# Patient Record
Sex: Male | Born: 1970
Health system: Western US, Academic
[De-identification: ages and names within clinical notes are randomized; demographics above are authoritative.]

## PROBLEM LIST (undated history)

## (undated) HISTORY — PX: OTHER SURGICAL HISTORY: SHX169

---

## 2013-11-19 ENCOUNTER — Telehealth (INDEPENDENT_AMBULATORY_CARE_PROVIDER_SITE_OTHER): Payer: Self-pay | Admitting: Micrographic Dermatologic Surgery

## 2013-11-19 NOTE — Telephone Encounter (Signed)
Lm on 11/19/13 for pt to call our office to schedule pre-op appointment with Dr. Tylene FantasiaJiang

## 2019-11-12 DIAGNOSIS — U071 COVID-19: Secondary | ICD-10-CM

## 2019-11-12 HISTORY — DX: COVID-19: U07.1

## 2019-11-21 ENCOUNTER — Encounter (HOSPITAL_BASED_OUTPATIENT_CLINIC_OR_DEPARTMENT_OTHER): Payer: Self-pay | Admitting: *Deleted

## 2019-11-21 ENCOUNTER — Other Ambulatory Visit: Payer: Self-pay

## 2019-11-21 ENCOUNTER — Emergency Department (HOSPITAL_BASED_OUTPATIENT_CLINIC_OR_DEPARTMENT_OTHER): Payer: BC Managed Care – PPO

## 2019-11-21 ENCOUNTER — Inpatient Hospital Stay (HOSPITAL_BASED_OUTPATIENT_CLINIC_OR_DEPARTMENT_OTHER)
Admission: EM | Admit: 2019-11-21 | Discharge: 2019-12-13 | DRG: 871 | Disposition: E | Payer: BC Managed Care – PPO | Attending: Internal Medicine | Admitting: Internal Medicine

## 2019-11-21 DIAGNOSIS — M25561 Pain in right knee: Secondary | ICD-10-CM | POA: Diagnosis present

## 2019-11-21 DIAGNOSIS — R0602 Shortness of breath: Secondary | ICD-10-CM | POA: Diagnosis not present

## 2019-11-21 DIAGNOSIS — R578 Other shock: Secondary | ICD-10-CM | POA: Diagnosis not present

## 2019-11-21 DIAGNOSIS — J9383 Other pneumothorax: Secondary | ICD-10-CM | POA: Diagnosis not present

## 2019-11-21 DIAGNOSIS — A4189 Other specified sepsis: Secondary | ICD-10-CM | POA: Diagnosis not present

## 2019-11-21 DIAGNOSIS — U071 COVID-19: Secondary | ICD-10-CM

## 2019-11-21 DIAGNOSIS — R0902 Hypoxemia: Secondary | ICD-10-CM

## 2019-11-21 DIAGNOSIS — Z4659 Encounter for fitting and adjustment of other gastrointestinal appliance and device: Secondary | ICD-10-CM

## 2019-11-21 DIAGNOSIS — J1282 Pneumonia due to coronavirus disease 2019: Secondary | ICD-10-CM

## 2019-11-21 DIAGNOSIS — Y92239 Unspecified place in hospital as the place of occurrence of the external cause: Secondary | ICD-10-CM | POA: Diagnosis present

## 2019-11-21 DIAGNOSIS — J9690 Respiratory failure, unspecified, unspecified whether with hypoxia or hypercapnia: Secondary | ICD-10-CM

## 2019-11-21 DIAGNOSIS — N179 Acute kidney failure, unspecified: Secondary | ICD-10-CM

## 2019-11-21 DIAGNOSIS — E669 Obesity, unspecified: Secondary | ICD-10-CM | POA: Diagnosis present

## 2019-11-21 DIAGNOSIS — Z66 Do not resuscitate: Secondary | ICD-10-CM | POA: Diagnosis not present

## 2019-11-21 DIAGNOSIS — L89811 Pressure ulcer of head, stage 1: Secondary | ICD-10-CM | POA: Diagnosis not present

## 2019-11-21 DIAGNOSIS — J15211 Pneumonia due to Methicillin susceptible Staphylococcus aureus: Secondary | ICD-10-CM | POA: Diagnosis not present

## 2019-11-21 DIAGNOSIS — I517 Cardiomegaly: Secondary | ICD-10-CM | POA: Diagnosis present

## 2019-11-21 DIAGNOSIS — G9341 Metabolic encephalopathy: Secondary | ICD-10-CM

## 2019-11-21 DIAGNOSIS — B370 Candidal stomatitis: Secondary | ICD-10-CM

## 2019-11-21 DIAGNOSIS — R0600 Dyspnea, unspecified: Secondary | ICD-10-CM

## 2019-11-21 DIAGNOSIS — R34 Anuria and oliguria: Secondary | ICD-10-CM | POA: Diagnosis not present

## 2019-11-21 DIAGNOSIS — Z6839 Body mass index (BMI) 39.0-39.9, adult: Secondary | ICD-10-CM

## 2019-11-21 DIAGNOSIS — Z452 Encounter for adjustment and management of vascular access device: Secondary | ICD-10-CM

## 2019-11-21 DIAGNOSIS — D62 Acute posthemorrhagic anemia: Secondary | ICD-10-CM | POA: Diagnosis not present

## 2019-11-21 DIAGNOSIS — Z515 Encounter for palliative care: Secondary | ICD-10-CM

## 2019-11-21 DIAGNOSIS — L8990 Pressure ulcer of unspecified site, unspecified stage: Secondary | ICD-10-CM | POA: Insufficient documentation

## 2019-11-21 DIAGNOSIS — I469 Cardiac arrest, cause unspecified: Secondary | ICD-10-CM | POA: Diagnosis not present

## 2019-11-21 DIAGNOSIS — T82838A Hemorrhage of vascular prosthetic devices, implants and grafts, initial encounter: Secondary | ICD-10-CM | POA: Diagnosis not present

## 2019-11-21 DIAGNOSIS — Z9911 Dependence on respirator [ventilator] status: Secondary | ICD-10-CM

## 2019-11-21 DIAGNOSIS — J80 Acute respiratory distress syndrome: Secondary | ICD-10-CM | POA: Diagnosis not present

## 2019-11-21 DIAGNOSIS — Y838 Other surgical procedures as the cause of abnormal reaction of the patient, or of later complication, without mention of misadventure at the time of the procedure: Secondary | ICD-10-CM | POA: Diagnosis present

## 2019-11-21 DIAGNOSIS — R739 Hyperglycemia, unspecified: Secondary | ICD-10-CM | POA: Diagnosis present

## 2019-11-21 DIAGNOSIS — T380X5A Adverse effect of glucocorticoids and synthetic analogues, initial encounter: Secondary | ICD-10-CM

## 2019-11-21 DIAGNOSIS — F410 Panic disorder [episodic paroxysmal anxiety] without agoraphobia: Secondary | ICD-10-CM | POA: Diagnosis present

## 2019-11-21 DIAGNOSIS — E66812 Obesity, class 2: Secondary | ICD-10-CM

## 2019-11-21 DIAGNOSIS — I2699 Other pulmonary embolism without acute cor pulmonale: Secondary | ICD-10-CM

## 2019-11-21 DIAGNOSIS — J9601 Acute respiratory failure with hypoxia: Secondary | ICD-10-CM

## 2019-11-21 DIAGNOSIS — E875 Hyperkalemia: Secondary | ICD-10-CM | POA: Diagnosis not present

## 2019-11-21 DIAGNOSIS — J9600 Acute respiratory failure, unspecified whether with hypoxia or hypercapnia: Secondary | ICD-10-CM

## 2019-11-21 DIAGNOSIS — R040 Epistaxis: Secondary | ICD-10-CM | POA: Diagnosis present

## 2019-11-21 LAB — CBC WITH DIFFERENTIAL/PLATELET
Abs Immature Granulocytes: 0.03 10*3/uL (ref 0.00–0.07)
Basophils Absolute: 0 10*3/uL (ref 0.0–0.1)
Basophils Relative: 0 %
Eosinophils Absolute: 0 10*3/uL (ref 0.0–0.5)
Eosinophils Relative: 0 %
HCT: 42.3 % (ref 39.0–52.0)
Hemoglobin: 14.2 g/dL (ref 13.0–17.0)
Immature Granulocytes: 0 %
Lymphocytes Relative: 25 %
Lymphs Abs: 1.8 10*3/uL (ref 0.7–4.0)
MCH: 30.4 pg (ref 26.0–34.0)
MCHC: 33.6 g/dL (ref 30.0–36.0)
MCV: 90.6 fL (ref 80.0–100.0)
Monocytes Absolute: 0.2 10*3/uL (ref 0.1–1.0)
Monocytes Relative: 3 %
Neutro Abs: 5.2 10*3/uL (ref 1.7–7.7)
Neutrophils Relative %: 72 %
Platelets: 200 10*3/uL (ref 150–400)
RBC: 4.67 MIL/uL (ref 4.22–5.81)
RDW: 12.7 % (ref 11.5–15.5)
Smear Review: NORMAL
WBC Morphology: ABNORMAL
WBC: 7.2 10*3/uL (ref 4.0–10.5)
nRBC: 0 % (ref 0.0–0.2)

## 2019-11-21 LAB — COMPREHENSIVE METABOLIC PANEL
ALT: 39 U/L (ref 0–44)
AST: 77 U/L — ABNORMAL HIGH (ref 15–41)
Albumin: 3.1 g/dL — ABNORMAL LOW (ref 3.5–5.0)
Alkaline Phosphatase: 76 U/L (ref 38–126)
Anion gap: 13 (ref 5–15)
BUN: 11 mg/dL (ref 6–20)
CO2: 24 mmol/L (ref 22–32)
Calcium: 7.9 mg/dL — ABNORMAL LOW (ref 8.9–10.3)
Chloride: 99 mmol/L (ref 98–111)
Creatinine, Ser: 1.24 mg/dL (ref 0.61–1.24)
GFR, Estimated: >60 mL/min (ref >60)
Glucose, Bld: 139 mg/dL — ABNORMAL HIGH (ref 70–99)
Potassium: 3.7 mmol/L (ref 3.5–5.1)
Sodium: 136 mmol/L (ref 135–145)
Total Bilirubin: 0.4 mg/dL (ref 0.3–1.2)
Total Protein: 7.2 g/dL (ref 6.5–8.1)

## 2019-11-21 LAB — RESPIRATORY PANEL BY RT PCR (FLU A&B, COVID)
Influenza A by PCR: NEGATIVE
Influenza B by PCR: NEGATIVE
SARS Coronavirus 2 by RT PCR: POSITIVE — AB

## 2019-11-21 LAB — D-DIMER, QUANTITATIVE: D-Dimer, Quant: 0.69 ug/mL-FEU — ABNORMAL HIGH (ref 0.00–0.50)

## 2019-11-21 LAB — LACTIC ACID, PLASMA
Lactic Acid, Venous: 3 mmol/L (ref 0.5–1.9)
Lactic Acid, Venous: 1.2 mmol/L (ref 0.5–1.9)

## 2019-11-21 MED ORDER — SODIUM CHLORIDE 0.9 % IV SOLN
100.0000 mg | Freq: Every day | INTRAVENOUS | Status: AC
  Administered 2019-11-22 – 2019-11-25 (×4): 100 mg via INTRAVENOUS
  Filled 2019-11-21 (×4): qty 20

## 2019-11-21 MED ORDER — ACETAMINOPHEN 500 MG PO TABS
1000.0000 mg | ORAL_TABLET | Freq: Once | ORAL | Status: AC
  Administered 2019-11-21: 1000 mg via ORAL
  Filled 2019-11-21: qty 2

## 2019-11-21 MED ORDER — DEXAMETHASONE SODIUM PHOSPHATE 10 MG/ML IJ SOLN
8.0000 mg | Freq: Once | INTRAMUSCULAR | Status: AC
  Administered 2019-11-21: 8 mg via INTRAVENOUS
  Filled 2019-11-21: qty 1

## 2019-11-21 MED ORDER — SODIUM CHLORIDE 0.9 % IV SOLN
100.0000 mg | INTRAVENOUS | Status: AC
  Administered 2019-11-21 – 2019-11-22 (×2): 100 mg via INTRAVENOUS
  Filled 2019-11-21 (×2): qty 20

## 2019-11-21 MED ORDER — SODIUM CHLORIDE 0.9 % IV SOLN
INTRAVENOUS | Status: DC | PRN
  Administered 2019-11-21: 1000 mL via INTRAVENOUS
  Administered 2019-11-22: 250 mL via INTRAVENOUS

## 2019-11-21 MED ORDER — SODIUM CHLORIDE 0.9 % IV BOLUS
500.0000 mL | Freq: Once | INTRAVENOUS | Status: AC
  Administered 2019-11-21: 500 mL via INTRAVENOUS

## 2019-11-21 NOTE — ED Notes (Signed)
Stuart Singh, [place] aware of pt's lactic acid level of 3.0. I spoke with pt's girlfriend and updated per his request.

## 2019-11-21 NOTE — ED Provider Notes (Signed)
[facility] EMERGENCY DEPARTMENT Provider Note   CSN: [id number] Arrival date & time: 12/02/19  2048     History Chief Complaint  Patient presents with  . Shortness of Breath    Stuart Singh is a 49 y.o. male who presents for evaluation of difficulty breathing.  States that this has been going on for about a day but worsened today.  States he has had some headache and achiness.  He has not noted any fevers but has felt hot.  He does not smoke and denies any history of asthma or COPD.  He does state that he has been vaccinated for Covid.  He has not been around anybody has been sick.  He denies any chest pain, abdominal pain, vomiting.  EM LEVEL 5 CAVEAT DUE TO ACUITY OF CONDITION   The history is provided by the patient.       History reviewed. No pertinent past medical history.  Patient Active Problem List   Diagnosis Date Noted  . Pneumonia due to COVID-19 virus December 02, 2019     The histories are not reviewed yet. Please review them in the "History" navigator section and refresh this SmartLink.     No family history on file.  Social History   Tobacco Use  . Smoking status: Not on file  Substance Use Topics  . Alcohol use: Not on file  . Drug use: Not on file    Home Medications Prior to Admission medications   Not on File    Allergies    Anacin-3 [acetaminophen]  Review of Systems   Review of Systems  Constitutional: Positive for fever.  Respiratory: Positive for shortness of breath. Negative for cough.   Cardiovascular: Negative for chest pain.  Gastrointestinal: Negative for abdominal pain, nausea and vomiting.  Musculoskeletal: Positive for myalgias.  Neurological: Positive for headaches.  All other systems reviewed and are negative.   Physical Exam Updated Vital Signs BP 117/77   Pulse 93   Temp (!) 101.7 F (38.7 C) (Oral)   Resp (!) 43   Ht 5\' 8"  (1.727 m)   Wt 117.9 kg   SpO2 97%   BMI 39.53 kg/m   Physical Exam Vitals  and nursing note reviewed.  Constitutional:      Appearance: Normal appearance. He is well-developed. He is ill-appearing and diaphoretic.  HENT:     Head: Normocephalic and atraumatic.  Eyes:     General: Lids are normal.     Conjunctiva/sclera: Conjunctivae normal.     Pupils: Pupils are equal, round, and reactive to light.  Cardiovascular:     Rate and Rhythm: Normal rate and regular rhythm.     Pulses: Normal pulses.          Radial pulses are 2+ on the right side and 2+ on the left side.       Dorsalis pedis pulses are 2+ on the right side and 2+ on the left side.     Heart sounds: Normal heart sounds. No murmur heard.  No friction rub. No gallop.   Pulmonary:     Effort: Tachypnea and accessory muscle usage present.     Breath sounds: Decreased breath sounds and rales present.     Comments: Patient is tachypneic, with accessory muscle usage noted.  Rales noted diffusely in the left side.  Decreased air movement noted on the right.  Speaking in very short sentences this. Abdominal:     Palpations: Abdomen is soft. Abdomen is not rigid.  Tenderness: There is no abdominal tenderness. There is no guarding.     Comments: Abdomen is soft, non-distended, non-tender. No rigidity, No guarding. No peritoneal signs.  Musculoskeletal:        General: Normal range of motion.     Cervical back: Full passive range of motion without pain.     Comments: BLE are symmetric in appearance. No overlying warmth or erythema.   Skin:    General: Skin is warm.     Capillary Refill: Capillary refill takes less than 2 seconds.  Neurological:     Mental Status: He is alert and oriented to person, place, and time.  Psychiatric:        Speech: Speech normal.     ED Results / Procedures / Treatments   Labs (all labs ordered are listed, but only abnormal results are displayed) Labs Reviewed  RESPIRATORY PANEL BY RT PCR (FLU A&B, COVID) - Abnormal; Notable for the following components:      Result  Value   SARS Coronavirus 2 by RT PCR POSITIVE (*)    All other components within normal limits  LACTIC ACID, PLASMA - Abnormal; Notable for the following components:   Lactic Acid, Venous 3.0 (*)    All other components within normal limits  COMPREHENSIVE METABOLIC PANEL - Abnormal; Notable for the following components:   Glucose, Bld 139 (*)    Calcium 7.9 (*)    Albumin 3.1 (*)    AST 77 (*)    All other components within normal limits  D-DIMER, QUANTITATIVE (NOT AT [facility]) - Abnormal; Notable for the following components:   D-Dimer, Quant 0.69 (*)    All other components within normal limits  CULTURE, BLOOD (ROUTINE X 2)  CULTURE, BLOOD (ROUTINE X 2)  CBC WITH DIFFERENTIAL/PLATELET  LACTIC ACID, PLASMA  PROCALCITONIN  LACTATE DEHYDROGENASE  FERRITIN  TRIGLYCERIDES  FIBRINOGEN  C-REACTIVE PROTEIN    EKG None  Radiology DG Chest Port 1 View  Result Date: 12-19-19 CLINICAL DATA:  49 year old male with shortness of breath. EXAM: PORTABLE CHEST 1 VIEW COMPARISON:  None. FINDINGS: Bilateral confluent airspace opacities consistent with multifocal pneumonia likely viral or atypical in etiology including COVID-19. Clinical correlation is recommended. There is no pleural effusion pneumothorax. Top-normal cardiac silhouette. No acute osseous pathology. IMPRESSION: Multifocal pneumonia. Electronically Signed   By: Elgie Collard M.D.   On: 12-19-2019 21:02    Procedures .Critical Care Performed by: Maxwell Caul, PA-C Authorized by: Maxwell Caul, PA-C   Critical care provider statement:    Critical care time (minutes):  45   Critical care was necessary to treat or prevent imminent or life-threatening deterioration of the following conditions:  Respiratory failure   Critical care was time spent personally by me on the following activities:  Discussions with consultants, evaluation of patient's response to treatment, examination of patient, ordering and performing  treatments and interventions, ordering and review of laboratory studies, ordering and review of radiographic studies, pulse oximetry, re-evaluation of patient's condition, obtaining history from patient or surrogate and review of old charts   (including critical care time)  Medications Ordered in ED Medications  remdesivir 100 mg in sodium chloride 0.9 % 100 mL IVPB (has no administration in time range)  remdesivir 100 mg in sodium chloride 0.9 % 100 mL IVPB (has no administration in time range)  0.9 %  sodium chloride infusion (1,000 mLs Intravenous New Bag/Given December 19, 2019 2319)  dexamethasone (DECADRON) injection 8 mg (8 mg Intravenous Given Dec 19, 2019 2104)  acetaminophen (TYLENOL) tablet 1,000 mg (1,000 mg Oral Given 12/04/2019 2105)  sodium chloride 0.9 % bolus 500 mL (500 mLs Intravenous New Bag/Given 11/20/2019 2100)    ED Course  I have reviewed the triage vital signs and the nursing notes.  Pertinent labs & imaging results that were available during my care of the patient were reviewed by me and considered in my medical decision making (see chart for details).    MDM Rules/Calculators/A&P                          49 year old male who presents for evaluation of shortness of breath.  Patient reports that this has been ongoing for last couple days but worsened today.  He has felt hot and cold at home but has not actually measured a temperature at home.  He has been vaccinated for Covid.  On initially arrival, he was febrile at 103.3, hypoxic with O2 sats of 53%, tachypneic with respiratory rate in the 50s.  He was emergently rushed back to the resuscitation bay and was started on nasal cannula.  On 5 L nasal cannula, he was still maintaining sats at about 76%.  He was transitioned to a nonrebreather which improved his O2 sats to 94-96%.  Patient became more comfortable.  On exam, he was tachypneic, with accessory muscle usage.  He was able to speak in very short sentences.  On a lung exam, he  had decreased breath sounds noted to the right with diffuse crackles noted to the left.  Concern for infectious etiology versus COVID-19.  He has been vaccinated.  I discussed with his significant other.  She reports that patient recently attended the funeral of his father about a week ago.  About 2 days later, his aunt tested positive for Covid.  Chest x-ray looks concerning for multifocal pneumonia.  Patient is Covid positive.  CMP shows normal BUN and creatinine.  CBC shows no leukocytosis.  Lactic is 3.  Will give a small course of fluids given his Covid status.  At this time, given his hypoxia, will need admission.  He is satting well on high flow oxygen.  He is at 98% and appears much more well-appearing.  His repeat temperature is 101.6.  He is still slightly tachycardic.  Patient given small amount of fluids.  Remdesivir ordered.  Will consult hospitalist for admission.  Discussed patient with Dr. Loney Loh (hospitalist) who accepts patient for admission.   Stuart Singh was evaluated in Emergency Department on 11/20/2019 for the symptoms described in the history of present illness. He was evaluated in the context of the global COVID-19 pandemic, which necessitated consideration that the patient might be at risk for infection with the SARS-CoV-2 virus that causes COVID-19. Institutional protocols and algorithms that pertain to the evaluation of patients at risk for COVID-19 are in a state of rapid change based on information released by regulatory bodies including the CDC and federal and state organizations. These policies and algorithms were followed during the patient's care in the ED.  Portions of this note were generated with Scientist, clinical (histocompatibility and immunogenetics). Dictation errors may occur despite best attempts at proofreading.  Final Clinical Impression(s) / ED Diagnoses Final diagnoses:  COVID-19  Pneumonia due to COVID-19 virus  Hypoxia    Rx / DC Orders ED Discharge Orders    None         Maxwell Caul, PA-C 11/22/2019 2320    Milagros Loll, MD 11/24/19 [phone]

## 2019-11-21 NOTE — ED Notes (Signed)
Mardella Layman PA  aware that pt is covid positive

## 2019-11-21 NOTE — ED Triage Notes (Addendum)
This RN was standing in the lobby when pt's family member asked for help getting pt out of car. Assisted pt with getting out of the car. He was very sob and with little air movement. Unable to speak complete sentences. Pt was taken to triage and an 02 sat were 53% on RA. States he has been covid vaccinated, but has been feeling sob  today. Pt taken to room 14 and Graciella Freer, [place] was there on arrival. Pt placed on 100 % non re-breather mask @ 15l and high flow nasal cannula at 15l n/c. Sats increased to 90%

## 2019-11-21 NOTE — ED Notes (Signed)
Pt's 02 sats 98%, HR 108 ST, RR 30, able to speak in more complete sentences at present. Lab work is in progress. Covid test sent.

## 2019-11-22 ENCOUNTER — Encounter (HOSPITAL_COMMUNITY): Payer: Self-pay | Admitting: Internal Medicine

## 2019-11-22 DIAGNOSIS — I2699 Other pulmonary embolism without acute cor pulmonale: Secondary | ICD-10-CM | POA: Diagnosis not present

## 2019-11-22 DIAGNOSIS — E875 Hyperkalemia: Secondary | ICD-10-CM | POA: Diagnosis not present

## 2019-11-22 DIAGNOSIS — J80 Acute respiratory distress syndrome: Secondary | ICD-10-CM | POA: Diagnosis not present

## 2019-11-22 DIAGNOSIS — R040 Epistaxis: Secondary | ICD-10-CM | POA: Diagnosis present

## 2019-11-22 DIAGNOSIS — G9341 Metabolic encephalopathy: Secondary | ICD-10-CM | POA: Diagnosis not present

## 2019-11-22 DIAGNOSIS — J1282 Pneumonia due to coronavirus disease 2019: Secondary | ICD-10-CM | POA: Diagnosis present

## 2019-11-22 DIAGNOSIS — Z9911 Dependence on respirator [ventilator] status: Secondary | ICD-10-CM | POA: Diagnosis not present

## 2019-11-22 DIAGNOSIS — I517 Cardiomegaly: Secondary | ICD-10-CM | POA: Diagnosis not present

## 2019-11-22 DIAGNOSIS — J9383 Other pneumothorax: Secondary | ICD-10-CM | POA: Diagnosis not present

## 2019-11-22 DIAGNOSIS — J9601 Acute respiratory failure with hypoxia: Secondary | ICD-10-CM | POA: Diagnosis not present

## 2019-11-22 DIAGNOSIS — M25561 Pain in right knee: Secondary | ICD-10-CM | POA: Diagnosis present

## 2019-11-22 DIAGNOSIS — D62 Acute posthemorrhagic anemia: Secondary | ICD-10-CM | POA: Diagnosis not present

## 2019-11-22 DIAGNOSIS — Z66 Do not resuscitate: Secondary | ICD-10-CM | POA: Diagnosis not present

## 2019-11-22 DIAGNOSIS — A4189 Other specified sepsis: Secondary | ICD-10-CM | POA: Diagnosis present

## 2019-11-22 DIAGNOSIS — N170 Acute kidney failure with tubular necrosis: Secondary | ICD-10-CM | POA: Diagnosis not present

## 2019-11-22 DIAGNOSIS — R0902 Hypoxemia: Secondary | ICD-10-CM | POA: Diagnosis not present

## 2019-11-22 DIAGNOSIS — E669 Obesity, unspecified: Secondary | ICD-10-CM | POA: Diagnosis not present

## 2019-11-22 DIAGNOSIS — Z6839 Body mass index (BMI) 39.0-39.9, adult: Secondary | ICD-10-CM | POA: Diagnosis not present

## 2019-11-22 DIAGNOSIS — F410 Panic disorder [episodic paroxysmal anxiety] without agoraphobia: Secondary | ICD-10-CM | POA: Diagnosis present

## 2019-11-22 DIAGNOSIS — J9600 Acute respiratory failure, unspecified whether with hypoxia or hypercapnia: Secondary | ICD-10-CM | POA: Diagnosis not present

## 2019-11-22 DIAGNOSIS — U071 COVID-19: Secondary | ICD-10-CM

## 2019-11-22 DIAGNOSIS — R609 Edema, unspecified: Secondary | ICD-10-CM | POA: Diagnosis not present

## 2019-11-22 DIAGNOSIS — Y92239 Unspecified place in hospital as the place of occurrence of the external cause: Secondary | ICD-10-CM | POA: Diagnosis present

## 2019-11-22 DIAGNOSIS — B370 Candidal stomatitis: Secondary | ICD-10-CM | POA: Diagnosis not present

## 2019-11-22 DIAGNOSIS — Y838 Other surgical procedures as the cause of abnormal reaction of the patient, or of later complication, without mention of misadventure at the time of the procedure: Secondary | ICD-10-CM | POA: Diagnosis present

## 2019-11-22 DIAGNOSIS — J1520 Pneumonia due to staphylococcus, unspecified: Secondary | ICD-10-CM | POA: Diagnosis not present

## 2019-11-22 DIAGNOSIS — N179 Acute kidney failure, unspecified: Secondary | ICD-10-CM | POA: Diagnosis not present

## 2019-11-22 DIAGNOSIS — T380X5A Adverse effect of glucocorticoids and synthetic analogues, initial encounter: Secondary | ICD-10-CM | POA: Diagnosis not present

## 2019-11-22 DIAGNOSIS — R0602 Shortness of breath: Secondary | ICD-10-CM | POA: Diagnosis present

## 2019-11-22 DIAGNOSIS — T82838A Hemorrhage of vascular prosthetic devices, implants and grafts, initial encounter: Secondary | ICD-10-CM | POA: Diagnosis not present

## 2019-11-22 DIAGNOSIS — R739 Hyperglycemia, unspecified: Secondary | ICD-10-CM | POA: Diagnosis present

## 2019-11-22 DIAGNOSIS — J15211 Pneumonia due to Methicillin susceptible Staphylococcus aureus: Secondary | ICD-10-CM | POA: Diagnosis not present

## 2019-11-22 DIAGNOSIS — Z515 Encounter for palliative care: Secondary | ICD-10-CM | POA: Diagnosis not present

## 2019-11-22 LAB — TRIGLYCERIDES: Triglycerides: 148 mg/dL (ref <150)

## 2019-11-22 LAB — COMPREHENSIVE METABOLIC PANEL
ALT: 38 U/L (ref 0–44)
AST: 64 U/L — ABNORMAL HIGH (ref 15–41)
Albumin: 3 g/dL — ABNORMAL LOW (ref 3.5–5.0)
Alkaline Phosphatase: 73 U/L (ref 38–126)
Anion gap: 13 (ref 5–15)
BUN: 12 mg/dL (ref 6–20)
CO2: 25 mmol/L (ref 22–32)
Calcium: 8.3 mg/dL — ABNORMAL LOW (ref 8.9–10.3)
Chloride: 102 mmol/L (ref 98–111)
Creatinine, Ser: 1.04 mg/dL (ref 0.61–1.24)
GFR, Estimated: >60 mL/min (ref >60)
Glucose, Bld: 127 mg/dL — ABNORMAL HIGH (ref 70–99)
Potassium: 4.4 mmol/L (ref 3.5–5.1)
Sodium: 140 mmol/L (ref 135–145)
Total Bilirubin: 0.7 mg/dL (ref 0.3–1.2)
Total Protein: 7.1 g/dL (ref 6.5–8.1)

## 2019-11-22 LAB — CBC WITH DIFFERENTIAL/PLATELET
Abs Immature Granulocytes: 0.01 10*3/uL (ref 0.00–0.07)
Basophils Absolute: 0 10*3/uL (ref 0.0–0.1)
Basophils Relative: 0 %
Eosinophils Absolute: 0 10*3/uL (ref 0.0–0.5)
Eosinophils Relative: 0 %
HCT: 40.3 % (ref 39.0–52.0)
Hemoglobin: 13.3 g/dL (ref 13.0–17.0)
Immature Granulocytes: 0 %
Lymphocytes Relative: 12 %
Lymphs Abs: 0.5 10*3/uL — ABNORMAL LOW (ref 0.7–4.0)
MCH: 30.6 pg (ref 26.0–34.0)
MCHC: 33 g/dL (ref 30.0–36.0)
MCV: 92.9 fL (ref 80.0–100.0)
Monocytes Absolute: 0.2 10*3/uL (ref 0.1–1.0)
Monocytes Relative: 5 %
Neutro Abs: 3.3 10*3/uL (ref 1.7–7.7)
Neutrophils Relative %: 83 %
Platelets: 170 10*3/uL (ref 150–400)
RBC: 4.34 MIL/uL (ref 4.22–5.81)
RDW: 13.3 % (ref 11.5–15.5)
WBC: 4 10*3/uL (ref 4.0–10.5)
nRBC: 0 % (ref 0.0–0.2)

## 2019-11-22 LAB — C-REACTIVE PROTEIN
CRP: 9.6 mg/dL — ABNORMAL HIGH (ref <1.0)
CRP: 11.8 mg/dL — ABNORMAL HIGH (ref <1.0)

## 2019-11-22 LAB — FIBRINOGEN: Fibrinogen: 522 mg/dL — ABNORMAL HIGH (ref 210–475)

## 2019-11-22 LAB — GLUCOSE, CAPILLARY
Glucose-Capillary: 126 mg/dL — ABNORMAL HIGH (ref 70–99)
Glucose-Capillary: 125 mg/dL — ABNORMAL HIGH (ref 70–99)

## 2019-11-22 LAB — FERRITIN
Ferritin: 560 ng/mL — ABNORMAL HIGH (ref 24–336)
Ferritin: 529 ng/mL — ABNORMAL HIGH (ref 24–336)

## 2019-11-22 LAB — MRSA PCR SCREENING: MRSA by PCR: NEGATIVE

## 2019-11-22 LAB — LACTATE DEHYDROGENASE: LDH: 548 U/L — ABNORMAL HIGH (ref 98–192)

## 2019-11-22 LAB — PROCALCITONIN: Procalcitonin: 0.12 ng/mL

## 2019-11-22 MED ORDER — GUAIFENESIN-DM 100-10 MG/5ML PO SYRP
10.0000 mL | ORAL_SOLUTION | ORAL | Status: DC | PRN
  Administered 2019-11-29 (×2): 10 mL via ORAL
  Filled 2019-11-22 (×2): qty 10

## 2019-11-22 MED ORDER — ENOXAPARIN SODIUM 60 MG/0.6ML ~~LOC~~ SOLN
60.0000 mg | SUBCUTANEOUS | Status: DC
  Administered 2019-11-22 – 2019-11-23 (×2): 60 mg via SUBCUTANEOUS
  Filled 2019-11-22 (×2): qty 0.6

## 2019-11-22 MED ORDER — DEXAMETHASONE SODIUM PHOSPHATE 10 MG/ML IJ SOLN
6.0000 mg | Freq: Every day | INTRAMUSCULAR | Status: DC
  Administered 2019-11-22: 6 mg via INTRAVENOUS
  Filled 2019-11-22: qty 1

## 2019-11-22 MED ORDER — CHLORHEXIDINE GLUCONATE CLOTH 2 % EX PADS
6.0000 | MEDICATED_PAD | Freq: Every day | CUTANEOUS | Status: DC
  Administered 2019-11-22 – 2019-12-08 (×17): 6 via TOPICAL

## 2019-11-22 MED ORDER — ALBUTEROL SULFATE HFA 108 (90 BASE) MCG/ACT IN AERS
2.0000 | INHALATION_SPRAY | Freq: Four times a day (QID) | RESPIRATORY_TRACT | Status: DC
  Administered 2019-11-22 (×2): 2 via RESPIRATORY_TRACT
  Filled 2019-11-22: qty 6.7

## 2019-11-22 MED ORDER — NYSTATIN 100000 UNIT/ML MT SUSP
5.0000 mL | Freq: Four times a day (QID) | OROMUCOSAL | Status: DC
  Administered 2019-11-22 – 2019-12-09 (×47): 500000 [IU] via ORAL
  Filled 2019-11-22 (×47): qty 5

## 2019-11-22 MED ORDER — HYDROCOD POLST-CPM POLST ER 10-8 MG/5ML PO SUER: 5.0000 mL | Freq: Two times a day (BID) | ORAL | Status: DC | PRN

## 2019-11-22 MED ORDER — CHLORHEXIDINE GLUCONATE 0.12 % MT SOLN
15.0000 mL | Freq: Two times a day (BID) | OROMUCOSAL | Status: DC
  Administered 2019-11-22 – 2019-12-07 (×25): 15 mL via OROMUCOSAL
  Filled 2019-11-22 (×23): qty 15

## 2019-11-22 MED ORDER — TOCILIZUMAB 400 MG/20ML IV SOLN
800.0000 mg | Freq: Once | INTRAVENOUS | Status: AC
  Administered 2019-11-22: 800 mg via INTRAVENOUS
  Filled 2019-11-22: qty 40

## 2019-11-22 MED ORDER — ZINC SULFATE 220 (50 ZN) MG PO CAPS
220.0000 mg | ORAL_CAPSULE | Freq: Every day | ORAL | Status: DC
  Administered 2019-11-22 – 2019-12-05 (×10): 220 mg via ORAL
  Filled 2019-11-22 (×12): qty 1

## 2019-11-22 MED ORDER — ORAL CARE MOUTH RINSE
15.0000 mL | Freq: Two times a day (BID) | OROMUCOSAL | Status: DC
  Administered 2019-11-22 – 2019-12-06 (×26): 15 mL via OROMUCOSAL

## 2019-11-22 MED ORDER — ASCORBIC ACID 500 MG PO TABS
500.0000 mg | ORAL_TABLET | Freq: Every day | ORAL | Status: DC
  Administered 2019-11-22 – 2019-12-05 (×10): 500 mg via ORAL
  Filled 2019-11-22 (×12): qty 1

## 2019-11-22 NOTE — Progress Notes (Signed)
eLink Physician-Brief Progress Note Patient Name: Stuart Singh DOB: 05-25-1970 MRN: [id number]   Date of Service  11/22/2019  HPI/Events of Note  Increased WOB on HHFNC + NRBM. RR = 42 and Sat = 84%  eICU Interventions  Plan: 1. Trial of continuous BiPAP.     Intervention Category Major Interventions: Respiratory failure - evaluation and management;Hypoxemia - evaluation and management  Lenell Antu 11/22/2019, 8:14 PM

## 2019-11-22 NOTE — Care Plan (Signed)
PT with increased WOB and RR (40's-60) and increasing oxygen requirements at rest (60L/100% to 70L/100%) without improvement in 02 sat (77-84%). RT notified. ELink RN Felicia notified of pt's current status. Awaiting orders from on call MD.

## 2019-11-22 NOTE — Consult Note (Signed)
NAME:  Stuart Singh, MRN:  [id number], DOB:  10/17/1970, LOS: 0 ADMISSION DATE:  12/10/2019, CONSULTATION DATE:  10/11 REFERRING MD:  Dr. Ronaldo Miyamoto CHIEF COMPLAINT:  SOB, COVID PNA   Brief History   49 y/o M admitted with acute hypoxic respiratory failure with bilateral infiltrates in the setting of COVID PNA.   History of present illness   49 y/o M who presented to [facility] ER 10/10 with reports of shortness of breath.   The patient arrived via private vehicle and required assistance out of the car.  He was found to have room air saturations of 53%.  He reported on admit that he has been vaccinated for COVID.  He attended a funeral 2 weeks prior to admit and after the funeral, his aunt tested positive for COVID.  He reported on admit that he had been feeling short of breath for approximately two days but worsened on the day of presentation.  He was febrile to 103.3, hypoxic with saturations of 53%, and tachypneic.  COVID testing was positive. Initial labs notable for BUN 11/ Sr Cr 1.24, AST 77 / ALT 39, LDH 548, Ferritin 560, CRP 9.6, Fibrinogen 522, D-Dimer 0.69, Lactic Acid 3.0, PCT 0.12, WBC 7.2, HGb 14.2, platelets 200.  CXR demonstrated diffuse bilateral infiltrates.  He was admitted to [facility]    PCCM consulted 10/11 for evaluation.   Past Medical History    Significant Hospital Events   10/10 Admit with COVID PNA   Consults:     Procedures:     Significant Diagnostic Tests:    Micro Data:  COVID 10/10 >> positive  Influenza A/B 10/10 >> negative BCx2 10/10 >>  MRSA PCR 10/10 >> negative   Antimicrobials:    Interim history/subjective:  Pt remains on salter 15L + NRB  Afebrile   Objective   Blood pressure (!) 137/95, pulse 77, temperature 97.9 F (36.6 C), temperature source Axillary, resp. rate (!) 36, height 5\' 8"  (1.727 m), weight 118 kg, SpO2 95 %.    FiO2 (%):  [100 %] 100 %   Intake/Output Summary (Last 24 hours) at 11/22/2019 1343 Last data filed at 11/22/2019  1253 Gross per 24 hour  Intake 278.24 ml  Output --  Net 278.24 ml   Filed Weights   12-10-2019 2148 11/22/19 0946  Weight: 117.9 kg 118 kg    Examination: Blood pressure (!) 159/102, pulse 78, temperature 97.9 F (36.6 C), temperature source Axillary, resp. rate (!) 37, height 5\' 8"  (1.727 m), weight 118 kg, SpO2 90 %. Gen:      No acute distress HEENT:  EOMI, sclera anicteric Neck:     No masses; no thyromegaly Lungs:    Clear to auscultation bilaterally; normal respiratory effort CV:         Regular rate and rhythm; no murmurs Abd:      + bowel sounds; soft, non-tender; no palpable masses, no distension Ext:    No edema; adequate peripheral perfusion Skin:      Warm and dry; no rash Neuro: alert and oriented x 3 Psych: normal mood and affect  CXR 10/10 >> images personally reviewed, diffuse bilateral infiltrates, cardiomegaly   Resolved Hospital Problem list      Assessment & Plan:   Acute Hypoxic Respiratory Failure in setting of Diffuse Bilateral Infiltrates secondary to COVID PNA Pt started on decadron, remdesivir 10/10,  Actemra 10/11.  Reportedly vaccinated.  -wean O2 for sats >85% -prone positioning as tolerated  -goal CVP <4, diuresis as  necessary -follow intermittent CXR -PRN tussionex for cough  -decadron for 3-5 days then taper to off pending O2 response -remdesivir, trend LFT's  -Tocilizumab   Best practice:  Diet: Regular, as tolerated  Pain/Anxiety/Delirium protocol (if indicated): n/a  VAP protocol (if indicated): n/a  DVT prophylaxis: Lovenox  GI prophylaxis: n/a Glucose control: per primary  Mobility: as tolerated Code Status: Full Code  Family Communication: Patient updated on plan of care  Disposition: per primary   Labs   CBC: Recent Labs  Lab 12/05/2019 2100 11/22/19 1113  WBC 7.2 4.0  NEUTROABS 5.2 3.3  HGB 14.2 13.3  HCT 42.3 40.3  MCV 90.6 92.9  PLT 200 170    Basic Metabolic Panel: Recent Labs  Lab 11/13/2019 2100  11/22/19 1113  NA 136 140  K 3.7 4.4  CL 99 102  CO2 24 25  GLUCOSE 139* 127*  BUN 11 12  CREATININE 1.24 1.04  CALCIUM 7.9* 8.3*   GFR: Estimated Creatinine Clearance: 104.8 mL/min (by C-G formula based on SCr of 1.04 mg/dL). Recent Labs  Lab 11/19/2019 2100 11/20/2019 2314 11/22/19 1113  PROCALCITON 0.12  --   --   WBC 7.2  --  4.0  LATICACIDVEN 3.0* 1.2  --     Liver Function Tests: Recent Labs  Lab 12/02/2019 2100 11/22/19 1113  AST 77* 64*  ALT 39 38  ALKPHOS 76 73  BILITOT 0.4 0.7  PROT 7.2 7.1  ALBUMIN 3.1* 3.0*   No results for input(s): LIPASE, AMYLASE in the last 168 hours. No results for input(s): AMMONIA in the last 168 hours.  ABG No results found for: PHART, PCO2ART, PO2ART, HCO3, TCO2, ACIDBASEDEF, O2SAT   Coagulation Profile: No results for input(s): INR, PROTIME in the last 168 hours.  Cardiac Enzymes: No results for input(s): CKTOTAL, CKMB, CKMBINDEX, TROPONINI in the last 168 hours.  HbA1C: No results found for: HGBA1C  CBG: Recent Labs  Lab 11/22/19 1200  GLUCAP 126*    Review of Systems: Positives in [place]   Gen: Denies fever, chills, weight change, fatigue, night sweats HEENT: Denies blurred vision, double vision, hearing loss, tinnitus, sinus congestion, rhinorrhea, sore throat, neck stiffness, dysphagia PULM: Denies shortness of breath, cough, sputum production, hemoptysis, wheezing CV: Denies chest pain, edema, orthopnea, paroxysmal nocturnal dyspnea, palpitations GI: Denies abdominal pain, nausea, vomiting, diarrhea, hematochezia, melena, constipation, change in bowel habits GU: Denies dysuria, hematuria, polyuria, oliguria, urethral discharge Endocrine: Denies hot or cold intolerance, polyuria, polyphagia or appetite change Derm: Denies rash, dry skin, scaling or peeling skin change Heme: Denies easy bruising, bleeding, bleeding gums Neuro: Denies headache, numbness, weakness, slurred speech, loss of memory or  consciousness  Past Medical History  He,  has no past medical history on file.   Surgical History    Past Surgical History:  Procedure Laterality Date  . thumb surgery       Social History      Family History   His family history is not on file.   Allergies Allergies  Allergen Reactions  . Codeine Rash  . Anacin-3 [Acetaminophen]     Tylenol 3     Home Medications  Prior to Admission medications   Not on File     Critical care time:     Attending note: I have seen and examined the patient. History, labs and imaging reviewed.  49 year old with no significant past medical history presenting with COVID-19 infection. PCCM consulted for help with management  Blood pressure (!) 159/102, pulse 78,  temperature 97.9 F (36.6 C), temperature source Axillary, resp. rate (!) 37, height 5\' 8"  (1.727 m), weight 118 kg, SpO2 90 %. Gen:      No acute distress HEENT:  EOMI, sclera anicteric Neck:     No masses; no thyromegaly Lungs:    Clear to auscultation bilaterally; normal respiratory effort CV:         Regular rate and rhythm; no murmurs Abd:      + bowel sounds; soft, non-tender; no palpable masses, no distension Ext:    No edema; adequate peripheral perfusion Skin:      Warm and dry; no rash Neuro: alert and oriented x 3 Psych: normal mood and affect   Labs/Imaging personally reviewed, significant for Chest x-ray 11/25/2019-multifocal airspace opacities BUN/creatinine 12/1.04, WBC 4, hemoglobin 13.3, platelets 170  Assessment/plan: COVID-19 pneumonia Continue remdesivir, Decadron He is going to get a dose of Actemra Incentive spirometry, flutter valve Awake proning as tolerated Follow intermittent chest x-ray  We will continue to follow.  The patient is critically ill with multiple organ systems failure and requires high complexity decision making for assessment and support, frequent evaluation and titration of therapies, application of advanced monitoring  technologies and extensive interpretation of multiple databases.  Critical care time - 35 mins. This represents my time independent of the NPs time taking care of the pt.  06-15-1986 MD [facility] 11/22/2019, 3:06 PM

## 2019-11-22 NOTE — ED Notes (Signed)
[facility] report has been given

## 2019-11-22 NOTE — Progress Notes (Signed)
Orthopedic Tech Progress Note Patient Details:  Stuart Singh 1971-01-09 [id number]  Ortho Devices Type of Ortho Device: Knee Immobilizer Ortho Device/Splint Location: drop off       Saul Fordyce 11/22/2019, 1:08 PM

## 2019-11-22 NOTE — ED Notes (Signed)
Gave report to RN at [facility]. Carelink has been called and is on their way. Pt is aware

## 2019-11-22 NOTE — H&P (Signed)
History and Physical    Stuart Singh AOZ:[id number] DOB: Apr 18, 1970 DOA: 11/18/2019  PCP: Patient, No Pcp Per  Patient coming from: [facility]  Chief Complaint: Dyspnea  HPI: Stuart Singh is a 49 y.o. male with no past medical history. Presenting w/ 1 day of sudden onset dyspnea. He states that he was in his normal state of health, except for a bump on his head through yesterday. However, he woke with shortness of breath. At first he was not concerned. He didn't try any medicines or remedies. Movement aggravated the situation, but nothing brought relief. His breathing worsened throughout the day. He became concerned that he was developing PNA, so he went to the ED.   Of note, he has had both doses of the Pfizer COVID vaccine.    ED Course: He was found to have multifocal PNA on CXR. He was found to have COVID 19. He was hypoxic with RA sats down to the 50's. He was placed on NRB/HFNC. TRH was called for admission.   Review of Systems:  Reports CP, palpitations, fever. Reports fatigue and aches. Review of systems is otherwise negative for all not mentioned in HPI.   PMHx History reviewed. No pertinent past medical history.  PSHx Past Surgical History:  Procedure Laterality Date  . thumb surgery      SocHx  has no history on file for tobacco use, alcohol use, and drug use.  Allergies  Allergen Reactions  . Anacin-3 [Acetaminophen]     Tylenol 3    FamHx No family history on file.  Prior to Admission medications   Not on File    Physical Exam: Vitals:   11/22/19 0730 11/22/19 0800 11/22/19 0946 11/22/19 1000  BP: 120/81 118/84 (!) 141/91 133/84  Pulse: 79 78 78 78  Resp: (!) 30 (!) 34 (!) 30 19  Temp:   97.8 F (36.6 C)   TempSrc:   Oral   SpO2: 92% 92% (!) 88% (!) 88%  Weight:   118 kg   Height:   5\' 8"  (1.727 m)     General: 49 y.o. male resting in bed in NAD Eyes: PERRL, normal sclera ENMT: Nares patent w/o discharge, orophaynx clear, dentition normal, ears w/o  discharge/lesions/ulcers Neck: Supple, trachea midline Cardiovascular: tachy, +S1, S2, no m/g/r, equal pulses throughout Respiratory: decreased at bases, scattered rhonchi, increased WOB GI: BS+, NDNT, no masses noted, no organomegaly noted MSK: No e/c/c Skin: No rashes, bruises, ulcerations noted Neuro: A&O x 3, no focal deficits Psyc: Appropriate interaction and affect, calm/cooperative  Labs on Admission: I have personally reviewed following labs and imaging studies  CBC: Recent Labs  Lab 12/12/2019 2100  WBC 7.2  NEUTROABS 5.2  HGB 14.2  HCT 42.3  MCV 90.6  PLT 200   Basic Metabolic Panel: Recent Labs  Lab 12/08/2019 2100  NA 136  K 3.7  CL 99  CO2 24  GLUCOSE 139*  BUN 11  CREATININE 1.24  CALCIUM 7.9*   GFR: Estimated Creatinine Clearance: 87.9 mL/min (by C-G formula based on SCr of 1.24 mg/dL). Liver Function Tests: Recent Labs  Lab 11/25/2019 2100  AST 77*  ALT 39  ALKPHOS 76  BILITOT 0.4  PROT 7.2  ALBUMIN 3.1*   No results for input(s): LIPASE, AMYLASE in the last 168 hours. No results for input(s): AMMONIA in the last 168 hours. Coagulation Profile: No results for input(s): INR, PROTIME in the last 168 hours. Cardiac Enzymes: No results for input(s): CKTOTAL, CKMB, CKMBINDEX, TROPONINI in the  last 168 hours. BNP (last 3 results) No results for input(s): PROBNP in the last 8760 hours. HbA1C: No results for input(s): HGBA1C in the last 72 hours. CBG: No results for input(s): GLUCAP in the last 168 hours. Lipid Profile: Recent Labs    11/30/2019 2100  TRIG 148   Thyroid Function Tests: No results for input(s): TSH, T4TOTAL, FREET4, T3FREE, THYROIDAB in the last 72 hours. Anemia Panel: Recent Labs    12/08/2019 2100  FERRITIN 560*   Urine analysis: No results found for: COLORURINE, APPEARANCEUR, LABSPEC, PHURINE, GLUCOSEU, HGBUR, BILIRUBINUR, KETONESUR, PROTEINUR, UROBILINOGEN, NITRITE, LEUKOCYTESUR  Radiological Exams on Admission: DG Chest  Port 1 View  Result Date: 11/26/2019 CLINICAL DATA:  49 year old male with shortness of breath. EXAM: PORTABLE CHEST 1 VIEW COMPARISON:  None. FINDINGS: Bilateral confluent airspace opacities consistent with multifocal pneumonia likely viral or atypical in etiology including COVID-19. Clinical correlation is recommended. There is no pleural effusion pneumothorax. Top-normal cardiac silhouette. No acute osseous pathology. IMPRESSION: Multifocal pneumonia. Electronically Signed   By: Elgie Collard M.D.   On: 11/20/2019 21:02    EKG: Independently reviewed. Sinus tach, no st changes  Assessment/Plan COVID 19 PNA Sepsis secondary to above     - admit to inpt, stepdown     - sepsis: fever, tachycardia, tachypnea, lactic acid 3.0 at presentatoin, acute hypoxic resp failure w/ NRB/HFNC use, source: multifocal PNA from COVID 19     - procal is 0.12     - remdes, decadron, actemra, IS, flutter, albuterol, anti-tussives     - wean O2 as able     - Trend inflammatory makers     - have talked with pulm; they will review the patient, appreciate their assistance   Right knee pain     - has follow up with PCP/ortho     - will get knee brace while here  DVT prophylaxis: lovenox  Code Status: FULL  Family Communication: None at bedside  Consults called: Pulm/CC  Admission status: Inpatient  Status is: Inpatient  Remains inpatient appropriate because:Inpatient level of care appropriate due to severity of illness   Dispo: The patient is from: Home              Anticipated d/c is to: Home              Anticipated d/c date is: > 3 days              Patient currently is not medically stable to d/c.  Teddy Spike DO [facility]  If 7PM-7AM, please contact night-coverage [email or web address]  11/22/2019, 10:57 AM

## 2019-11-23 ENCOUNTER — Inpatient Hospital Stay (HOSPITAL_COMMUNITY): Payer: BC Managed Care – PPO

## 2019-11-23 DIAGNOSIS — I517 Cardiomegaly: Secondary | ICD-10-CM | POA: Diagnosis not present

## 2019-11-23 DIAGNOSIS — R609 Edema, unspecified: Secondary | ICD-10-CM

## 2019-11-23 DIAGNOSIS — U071 COVID-19: Secondary | ICD-10-CM | POA: Diagnosis not present

## 2019-11-23 DIAGNOSIS — J1282 Pneumonia due to Coronavirus disease 2019: Secondary | ICD-10-CM | POA: Diagnosis not present

## 2019-11-23 LAB — COMPREHENSIVE METABOLIC PANEL
ALT: 38 U/L (ref 0–44)
AST: 64 U/L — ABNORMAL HIGH (ref 15–41)
Albumin: 3 g/dL — ABNORMAL LOW (ref 3.5–5.0)
Alkaline Phosphatase: 72 U/L (ref 38–126)
Anion gap: 13 (ref 5–15)
BUN: 15 mg/dL (ref 6–20)
CO2: 26 mmol/L (ref 22–32)
Calcium: 8.7 mg/dL — ABNORMAL LOW (ref 8.9–10.3)
Chloride: 105 mmol/L (ref 98–111)
Creatinine, Ser: 0.95 mg/dL (ref 0.61–1.24)
GFR, Estimated: >60 mL/min (ref >60)
Glucose, Bld: 130 mg/dL — ABNORMAL HIGH (ref 70–99)
Potassium: 5.1 mmol/L (ref 3.5–5.1)
Sodium: 144 mmol/L (ref 135–145)
Total Bilirubin: 0.7 mg/dL (ref 0.3–1.2)
Total Protein: 7.2 g/dL (ref 6.5–8.1)

## 2019-11-23 LAB — CBC WITH DIFFERENTIAL/PLATELET
Abs Immature Granulocytes: 0.03 10*3/uL (ref 0.00–0.07)
Basophils Absolute: 0 10*3/uL (ref 0.0–0.1)
Basophils Relative: 0 %
Eosinophils Absolute: 0 10*3/uL (ref 0.0–0.5)
Eosinophils Relative: 0 %
HCT: 40.8 % (ref 39.0–52.0)
Hemoglobin: 13.4 g/dL (ref 13.0–17.0)
Immature Granulocytes: 1 %
Lymphocytes Relative: 26 %
Lymphs Abs: 0.9 10*3/uL (ref 0.7–4.0)
MCH: 30.8 pg (ref 26.0–34.0)
MCHC: 32.8 g/dL (ref 30.0–36.0)
MCV: 93.8 fL (ref 80.0–100.0)
Monocytes Absolute: 0.2 10*3/uL (ref 0.1–1.0)
Monocytes Relative: 5 %
Neutro Abs: 2.3 10*3/uL (ref 1.7–7.7)
Neutrophils Relative %: 68 %
Platelets: 202 10*3/uL (ref 150–400)
RBC: 4.35 MIL/uL (ref 4.22–5.81)
RDW: 13.5 % (ref 11.5–15.5)
WBC: 3.4 10*3/uL — ABNORMAL LOW (ref 4.0–10.5)
nRBC: 0 % (ref 0.0–0.2)

## 2019-11-23 LAB — ECHOCARDIOGRAM COMPLETE
Area-P 1/2: 4.68 cm2
Height: 68 in
S' Lateral: 2.74 cm
Weight: 4162.28 oz

## 2019-11-23 LAB — GLUCOSE, CAPILLARY
Glucose-Capillary: 119 mg/dL — ABNORMAL HIGH (ref 70–99)
Glucose-Capillary: 131 mg/dL — ABNORMAL HIGH (ref 70–99)
Glucose-Capillary: 141 mg/dL — ABNORMAL HIGH (ref 70–99)
Glucose-Capillary: 141 mg/dL — ABNORMAL HIGH (ref 70–99)

## 2019-11-23 LAB — HIV ANTIBODY (ROUTINE TESTING W REFLEX): HIV Screen 4th Generation wRfx: NONREACTIVE

## 2019-11-23 LAB — C-REACTIVE PROTEIN: CRP: 9 mg/dL — ABNORMAL HIGH (ref <1.0)

## 2019-11-23 LAB — D-DIMER, QUANTITATIVE: D-Dimer, Quant: 2.91 ug/mL-FEU — ABNORMAL HIGH (ref 0.00–0.50)

## 2019-11-23 LAB — FERRITIN: Ferritin: 853 ng/mL — ABNORMAL HIGH (ref 24–336)

## 2019-11-23 MED ORDER — FUROSEMIDE 10 MG/ML IJ SOLN
20.0000 mg | Freq: Two times a day (BID) | INTRAMUSCULAR | Status: DC
  Administered 2019-11-23 – 2019-11-30 (×15): 20 mg via INTRAVENOUS
  Filled 2019-11-23 (×15): qty 2

## 2019-11-23 MED ORDER — INSULIN DETEMIR 100 UNIT/ML ~~LOC~~ SOLN
0.0750 [IU]/kg | Freq: Two times a day (BID) | SUBCUTANEOUS | Status: DC
  Administered 2019-11-23: 9 [IU] via SUBCUTANEOUS
  Filled 2019-11-23: qty 0.09

## 2019-11-23 MED ORDER — INSULIN ASPART 100 UNIT/ML ~~LOC~~ SOLN
0.0000 [IU] | Freq: Three times a day (TID) | SUBCUTANEOUS | Status: DC
  Administered 2019-11-23 (×2): 2 [IU] via SUBCUTANEOUS
  Administered 2019-11-24: 3 [IU] via SUBCUTANEOUS
  Administered 2019-11-24 (×2): 2 [IU] via SUBCUTANEOUS
  Administered 2019-11-25: 3 [IU] via SUBCUTANEOUS
  Administered 2019-11-25: 2 [IU] via SUBCUTANEOUS
  Administered 2019-11-25 – 2019-11-26 (×3): 3 [IU] via SUBCUTANEOUS
  Administered 2019-11-26 – 2019-11-27 (×4): 2 [IU] via SUBCUTANEOUS
  Administered 2019-11-28: 3 [IU] via SUBCUTANEOUS
  Administered 2019-11-28 – 2019-11-29 (×3): 2 [IU] via SUBCUTANEOUS
  Administered 2019-11-29 (×2): 3 [IU] via SUBCUTANEOUS
  Administered 2019-11-30: 2 [IU] via SUBCUTANEOUS
  Administered 2019-11-30 (×2): 5 [IU] via SUBCUTANEOUS
  Administered 2019-12-01 (×2): 2 [IU] via SUBCUTANEOUS
  Administered 2019-12-03 (×2): 3 [IU] via SUBCUTANEOUS
  Administered 2019-12-04 – 2019-12-05 (×4): 2 [IU] via SUBCUTANEOUS
  Administered 2019-12-05 (×2): 3 [IU] via SUBCUTANEOUS

## 2019-11-23 MED ORDER — METHYLPREDNISOLONE SODIUM SUCC 125 MG IJ SOLR
120.0000 mg | Freq: Two times a day (BID) | INTRAMUSCULAR | Status: DC
  Administered 2019-11-23 – 2019-11-29 (×13): 120 mg via INTRAVENOUS
  Filled 2019-11-23 (×13): qty 2

## 2019-11-23 MED ORDER — MORPHINE SULFATE (PF) 2 MG/ML IV SOLN
1.0000 mg | INTRAVENOUS | Status: DC | PRN
  Administered 2019-11-23 – 2019-12-05 (×7): 2 mg via INTRAVENOUS
  Filled 2019-11-23 (×7): qty 1

## 2019-11-23 MED ORDER — ALBUTEROL SULFATE HFA 108 (90 BASE) MCG/ACT IN AERS
6.0000 | INHALATION_SPRAY | Freq: Four times a day (QID) | RESPIRATORY_TRACT | Status: DC
  Administered 2019-11-23 – 2019-11-28 (×19): 6 via RESPIRATORY_TRACT
  Administered 2019-11-28: 2 via RESPIRATORY_TRACT

## 2019-11-23 MED ORDER — INSULIN ASPART 100 UNIT/ML ~~LOC~~ SOLN: 0.0000 [IU] | Freq: Every day | SUBCUTANEOUS | Status: DC

## 2019-11-23 MED ORDER — LINAGLIPTIN 5 MG PO TABS
5.0000 mg | ORAL_TABLET | Freq: Every day | ORAL | Status: DC
  Administered 2019-11-25 – 2019-12-01 (×7): 5 mg via ORAL
  Filled 2019-11-23 (×9): qty 1

## 2019-11-23 MED ORDER — INSULIN ASPART 100 UNIT/ML ~~LOC~~ SOLN
3.0000 [IU] | Freq: Three times a day (TID) | SUBCUTANEOUS | Status: DC
  Administered 2019-11-25 – 2019-12-01 (×11): 3 [IU] via SUBCUTANEOUS

## 2019-11-23 MED ORDER — TOCILIZUMAB 400 MG/20ML IV SOLN
800.0000 mg | Freq: Once | INTRAVENOUS | Status: AC
  Administered 2019-11-23: 800 mg via INTRAVENOUS
  Filled 2019-11-23: qty 40

## 2019-11-23 NOTE — TOC Initial Note (Signed)
Transition of Care [facility]) - Initial/Assessment Note    Patient Details  Name: Stuart Singh MRN: [id number] Date of Birth: 01/12/1971  [facility] CM/SW Contact:    Golda Acre, RN Phone Number: 11/23/2019, 8:33 AM  Clinical Narrative:                  48 y.o. male with no past medical history. Presenting w/ 1 day of sudden onset dyspnea. He states that he was in his normal state of health, except for a bump on his head through yesterday. However, he woke with shortness of breath. At first he was not concerned. He didn't try any medicines or remedies. Movement aggravated the situation, but nothing brought relief. His breathing worsened throughout the day. He became concerned that he was developing PNA, so he went to the ED.   Of note, he has had both doses of the Pfizer COVID vaccine.    ED Course: He was found to have multifocal PNA on CXR. He was found to have COVID 19. He was hypoxic with RA sats down to the 50's. He was placed on NRB/HFNC. TRH was called for admission.   following for progression and toc needs possible hhc will be needed. Expected Discharge Plan: Home w Home Health Services Barriers to Discharge: Barriers Unresolved (comment)   Patient Goals and CMS Choice Patient states their goals for this hospitalization and ongoing recovery are:: to go home CMS Medicare.gov Compare Post Acute Care list provided to:: Patient    Expected Discharge Plan and Services Expected Discharge Plan: Home w Home Health Services   Discharge Planning Services: CM Consult   Living arrangements for the past 2 months: Single Family Home                                      Prior Living Arrangements/Services Living arrangements for the past 2 months: Single Family Home Lives with:: Significant Other Patient language and need for interpreter reviewed:: Yes Do you feel safe going back to the place where you live?: Yes      Need for Family Participation in  Patient Care: Yes (Comment) Care giver support system in place?: Yes (comment)   Criminal Activity/Legal Involvement Pertinent to Current Situation/Hospitalization: No - Comment as needed  Activities of Daily Living Home Assistive Devices/Equipment: Brace (specify type) (knee imobilizer) ADL Screening (condition at time of admission) Patient's cognitive ability adequate to safely complete daily activities?: Yes Is the patient deaf or have difficulty hearing?: No Does the patient have difficulty seeing, even when wearing glasses/contacts?: No Does the patient have difficulty concentrating, remembering, or making decisions?: No Patient able to express need for assistance with ADLs?: Yes Does the patient have difficulty dressing or bathing?: No Independently performs ADLs?: Yes (appropriate for developmental age) Does the patient have difficulty walking or climbing stairs?: Yes Weakness of Legs: None Weakness of Arms/Hands: None  Permission Sought/Granted                  Emotional Assessment Appearance:: Appears older than stated age     Orientation: : Oriented to Self, Oriented to Place, Oriented to  Time, Oriented to Situation Alcohol / Substance Use: Not Applicable Psych Involvement: No (comment)  Admission diagnosis:  SOB (shortness of breath) [R06.02] Hypoxia [R09.02] Pneumonia due to COVID-19 virus [U07.1, J12.82] COVID-19 [U07.1] Patient Active Problem List   Diagnosis Date Noted  . Pneumonia  due to COVID-19 virus 11/19/2019   PCP:  Patient, No Pcp Per Pharmacy:   [id number] - [place], [place] - [address] [place] [address] Phone: [phone] Fax: [phone]     Social Determinants of Health (SDOH) Interventions    Readmission Risk Interventions No flowsheet data found.

## 2019-11-23 NOTE — Progress Notes (Signed)
[facility] Progress Note  Patient: Stuart Singh    PPJ:[id number]  DOA: 11/29/2019     Date of Service: the patient was seen and examined on 11/23/2019  Brief hospital course: Hospital history of obesity.  Covid vaccinated.  Presents with complaints of sudden onset of shortness of breath.  Found to have COVID-19 pneumonia with severe acute hypoxic respiratory failure. Currently plan is supportive care.  Assessment and Plan: 1.  Acute hypoxic respiratory failure POA. Acute COVID-19 Viral Pneumonia Sepsis POA.  Secondary to Covid 19 infection CXR: hazy bilateral peripheral opacities Met SIRS criteria with temperature 39.6, HR 109, RR 50 on admission. Oxygen requirement: 53% on room air.  Currently on BiPAP 100%.  ER 100% FiO2 on BiPAP 76 to 85% of saturation. Unable to tolerate 100 to 90% reduction in FiO2. Unable to tolerate morphine without significant drop in saturation. CRP: 9.6-11.8-9.0 Remdesivir: Started on 11/23/2019 Steroids: Started on Decadron on 11/29/2019.  Switch to Solu-Medrol on 11/23/2019. Baricitinib/Actemra(off-label use): Received Tocilizumab 800 mg on 11/22/2019 as well as on 11/23/2019. The investigational nature of this medication was discussed with the patient/HCPOA and they choose to proceed as the potential benefits are felt to outweigh risks at this time.  Antibiotics: No medication for now. Vitamin C and Zinc: Continue DVT Prophylaxis:  0.5 mg/kg per 24 hours. Prone positioning and incentive spirometer use recommended.  Overall plan: Patient condition tenuous.  High risk for decompensation and intubation.  Continue to monitor in the stepdown unit.  The treatment plan and use of medications and known side effects were discussed with patient/family. It was clearly explained that Complete risks and long-term side effects are unknown, however in the best clinical judgment they seem to be of some clinical benefit rather than medical risks. Patient/family  agree with the treatment plan and want to receive these treatments as indicated.   2.  Cardiomegaly Seen on chest x-ray. Echocardiogram performed. EF 60 to 65%.  Normal diastolic parameters.  No significant valvular abnormality.  No structural abnormality. IV Lasix x1.  3.  D-dimer elevation. Lower extremity Doppler negative.  4.  Elevated AST Secondary to Covid. Monitor while on remdesivir.  5.  Leukopenia Secondary to Covid. Monitor.  6.  Obesity Placing the patient at high risk for poor outcome. Body mass index is 39.55 kg/m.   Diet: Cardiac diet DVT Prophylaxis: Subcutaneous Lovenox      Advance goals of care discussion: Full code  Family Communication: no family was present at bedside, at the time of interview.  The pt provided permission to discuss medical plan with the family. Unable to reach the family.  Disposition:  Status is: Inpatient  Remains inpatient appropriate because:Hemodynamically unstable   Dispo: The patient is from: Home              Anticipated d/c is to: Home              Anticipated d/c date is: > 3 days              Patient currently is not medically stable to d/c.  Subjective: Continues to have shortness of breath but no nausea no vomiting.  No chest pain.  Has cough.  No abdominal pain.  Physical Exam:  General: Appear in severe distress, no Rash; Oral Mucosa Clear, moist. no Abnormal Neck Mass Or lumps, Conjunctiva normal  Cardiovascular: S1 and S2 Present, no Murmur, Respiratory: increased respiratory effort, Bilateral Air entry present and bilateral  Crackles, no wheezes Abdomen: Bowel Sound present,  Soft and no tenderness Extremities: trace Pedal edema Neurology: alert and oriented to time, place, and person affect appropriate. no new focal deficit Gait not checked due to patient safety concerns  Vitals:   11/23/19 1145 11/23/19 1200 11/23/19 1300 11/23/19 1400  BP:   113/81 128/82  Pulse:  74 82 86  Resp:  (!) 34 (!) 41  (!) 21  Temp: 98.1 F (36.7 C)     TempSrc: Oral     SpO2: (!) 87% (!) 85% (!) 86% 90%  Weight:      Height:        Intake/Output Summary (Last 24 hours) at 11/23/2019 1529 Last data filed at 11/23/2019 1400 Gross per 24 hour  Intake 320.58 ml  Output 2380 ml  Net -2059.42 ml   Filed Weights   12/02/2019 2148 11/22/19 0946  Weight: 117.9 kg 118 kg    Data Reviewed: I have personally reviewed and interpreted daily labs, tele strips, imagings as discussed above. I reviewed all nursing notes, pharmacy notes, vitals, pertinent old records I have discussed plan of care as described above with RN and patient/family.  CBC: Recent Labs  Lab 11/18/2019 2100 11/22/19 1113 11/23/19 0257  WBC 7.2 4.0 3.4*  NEUTROABS 5.2 3.3 2.3  HGB 14.2 13.3 13.4  HCT 42.3 40.3 40.8  MCV 90.6 92.9 93.8  PLT 200 170 010   Basic Metabolic Panel: Recent Labs  Lab 11/28/2019 2100 11/22/19 1113 11/23/19 0257  NA 136 140 144  K 3.7 4.4 5.1  CL 99 102 105  CO2 24 25 26   GLUCOSE 139* 127* 130*  BUN 11 12 15   CREATININE 1.24 1.04 0.95  CALCIUM 7.9* 8.3* 8.7*    Studies: DG CHEST PORT 1 VIEW  Result Date: 11/23/2019 CLINICAL DATA:  Shortness of breath.  COVID-19 positive EXAM: PORTABLE CHEST 1 VIEW COMPARISON:  November 21, 2019 FINDINGS: There is patchy airspace opacity in the lungs bilaterally with a slight degree of clearing bilaterally compared to 2 days prior. No new opacity evident. Heart is mildly enlarged with pulmonary vascularity normal. No adenopathy. No bone lesions. IMPRESSION: Multifocal airspace opacity consistent with atypical organism pneumonia. Slight clearing bilaterally compared to recent study. No new opacity evident. Mild cardiomegaly. No adenopathy. Electronically Signed   By: Lowella Grip III M.D.   On: 11/23/2019 08:59   ECHOCARDIOGRAM COMPLETE  Result Date: 11/23/2019    ECHOCARDIOGRAM REPORT   Patient Name:   Stuart Singh Date of Exam: 11/23/2019 Medical Rec #:   [id number]     Height:       68.0 in Accession #:    [id number]    Weight:       260.1 lb Date of Birth:  Sep 21, 1970     BSA:          2.285 m Patient Age:    49 years      BP:           119/59 mmHg Patient Gender: M             HR:           80 bpm. Exam Location:  Inpatient Procedure: 2D Echo Indications:    Cardiomegaly 429.3 / I51.7  History:        Patient has no prior history of Echocardiogram examinations.                 COVID 19.  Sonographer:    Darlina Sicilian RDCS Referring Phys: [id number] [place]  IMPRESSIONS  1. Left ventricular ejection fraction, by estimation, is 60 to 65%. The left ventricle has normal function. The left ventricle has no regional wall motion abnormalities. Left ventricular diastolic parameters were normal.  2. Right ventricular systolic function is normal. The right ventricular size is normal.  3. The mitral valve is normal in structure. No evidence of mitral valve regurgitation. No evidence of mitral stenosis.  4. The aortic valve is normal in structure. Aortic valve regurgitation is not visualized. No aortic stenosis is present.  5. The inferior vena cava is normal in size with greater than 50% respiratory variability, suggesting right atrial pressure of 3 mmHg. FINDINGS  Left Ventricle: Left ventricular ejection fraction, by estimation, is 60 to 65%. The left ventricle has normal function. The left ventricle has no regional wall motion abnormalities. The left ventricular internal cavity size was normal in size. There is  no left ventricular hypertrophy. Left ventricular diastolic parameters were normal. Normal left ventricular filling pressure. Right Ventricle: The right ventricular size is normal. No increase in right ventricular wall thickness. Right ventricular systolic function is normal. Left Atrium: Left atrial size was normal in size. Right Atrium: Right atrial size was normal in size. Pericardium: There is no evidence of pericardial effusion. Mitral Valve: The mitral  valve is normal in structure. No evidence of mitral valve regurgitation. No evidence of mitral valve stenosis. Tricuspid Valve: The tricuspid valve is normal in structure. Tricuspid valve regurgitation is not demonstrated. No evidence of tricuspid stenosis. Aortic Valve: The aortic valve is normal in structure. Aortic valve regurgitation is not visualized. No aortic stenosis is present. Pulmonic Valve: The pulmonic valve was normal in structure. Pulmonic valve regurgitation is not visualized. No evidence of pulmonic stenosis. Aorta: The aortic root is normal in size and structure. Venous: The inferior vena cava is normal in size with greater than 50% respiratory variability, suggesting right atrial pressure of 3 mmHg. IAS/Shunts: No atrial level shunt detected by color flow Doppler.  LEFT VENTRICLE PLAX 2D LVIDd:         4.27 cm  Diastology LVIDs:         2.74 cm  LV e' medial:    6.96 cm/s LV PW:         0.99 cm  LV E/e' medial:  8.9 LV IVS:        1.14 cm  LV e' lateral:   8.16 cm/s LVOT diam:     2.00 cm  LV E/e' lateral: 7.6 LV SV:         52 LV SV Index:   23 LVOT Area:     3.14 cm  LEFT ATRIUM             Index LA diam:        2.30 cm 1.01 cm/m LA Vol (A2C):   41.4 ml 18.09 ml/m LA Vol (A4C):   22.8 ml 9.98 ml/m LA Biplane Vol: 29.2 ml 12.78 ml/m  AORTIC VALVE LVOT Vmax:   99.20 cm/s LVOT Vmean:  65.800 cm/s LVOT VTI:    0.164 m  AORTA Ao Root diam: 3.70 cm MITRAL VALVE MV Area (PHT): 4.68 cm    SHUNTS MV Decel Time: 162 msec    Systemic VTI:  0.16 m MV E velocity: 61.80 cm/s  Systemic Diam: 2.00 cm MV A velocity: 47.70 cm/s MV E/A ratio:  1.30 Mihai Croitoru MD Electronically signed by Sanda Klein MD Signature Date/Time: 11/23/2019/1:40:14 PM    Final    VAS [place]  LOWER EXTREMITY VENOUS (DVT)  Result Date: 11/23/2019  Lower Venous DVT Study Indications: Edema.  Risk Factors: COVID 19 positive. Limitations: Poor ultrasound/tissue interface. Comparison Study: No prior studies. Performing  Technologist: Oliver Hum RVT  Examination Guidelines: A complete evaluation includes B-mode imaging, spectral Doppler, color Doppler, and power Doppler as needed of all accessible portions of each vessel. Bilateral testing is considered an integral part of a complete examination. Limited examinations for reoccurring indications may be performed as noted. The reflux portion of the exam is performed with the patient in reverse Trendelenburg.  +---------+---------------+---------+-----------+----------+--------------+ RIGHT    CompressibilityPhasicitySpontaneityPropertiesThrombus Aging +---------+---------------+---------+-----------+----------+--------------+ CFV      Full           Yes      Yes                                 +---------+---------------+---------+-----------+----------+--------------+ SFJ      Full                                                        +---------+---------------+---------+-----------+----------+--------------+ FV Prox  Full                                                        +---------+---------------+---------+-----------+----------+--------------+ FV Mid   Full                                                        +---------+---------------+---------+-----------+----------+--------------+ FV DistalFull                                                        +---------+---------------+---------+-----------+----------+--------------+ PFV      Full                                                        +---------+---------------+---------+-----------+----------+--------------+ POP      Full           Yes      Yes                                 +---------+---------------+---------+-----------+----------+--------------+ PTV      Full                                                        +---------+---------------+---------+-----------+----------+--------------+ PERO     Full                                                         +---------+---------------+---------+-----------+----------+--------------+   +---------+---------------+---------+-----------+----------+--------------+  LEFT     CompressibilityPhasicitySpontaneityPropertiesThrombus Aging +---------+---------------+---------+-----------+----------+--------------+ CFV      Full           Yes      Yes                                 +---------+---------------+---------+-----------+----------+--------------+ SFJ      Full                                                        +---------+---------------+---------+-----------+----------+--------------+ FV Prox  Full                                                        +---------+---------------+---------+-----------+----------+--------------+ FV Mid   Full                                                        +---------+---------------+---------+-----------+----------+--------------+ FV DistalFull                                                        +---------+---------------+---------+-----------+----------+--------------+ PFV      Full                                                        +---------+---------------+---------+-----------+----------+--------------+ POP      Full           Yes      Yes                                 +---------+---------------+---------+-----------+----------+--------------+ PTV      Full                                                        +---------+---------------+---------+-----------+----------+--------------+ PERO                                                  Not visualized +---------+---------------+---------+-----------+----------+--------------+     Summary: RIGHT: - There is no evidence of deep vein thrombosis in the lower extremity.  - No cystic structure found in the popliteal fossa.  LEFT: - There is no evidence of deep vein thrombosis in the lower extremity. However, portions of this  examination were limited- see technologist comments above.  -  No cystic structure found in the popliteal fossa.  *See table(s) above for measurements and observations. Electronically signed by Deitra Mayo MD on 11/23/2019 at 2:18:43 PM.    Final     Scheduled Meds: . albuterol  6 puff Inhalation Q6H  . vitamin C  500 mg Oral Daily  . chlorhexidine  15 mL Mouth Rinse BID  . Chlorhexidine Gluconate Cloth  6 each Topical Daily  . enoxaparin (LOVENOX) injection  60 mg Subcutaneous Q24H  . furosemide  20 mg Intravenous BID  . insulin aspart  0-15 Units Subcutaneous TID WC  . insulin aspart  0-5 Units Subcutaneous QHS  . insulin aspart  3 Units Subcutaneous TID WC  . linagliptin  5 mg Oral Daily  . mouth rinse  15 mL Mouth Rinse q12n4p  . methylPREDNISolone (SOLU-MEDROL) injection  120 mg Intravenous Q12H  . nystatin  5 mL Oral QID  . zinc sulfate  220 mg Oral Daily   Continuous Infusions: . sodium chloride Stopped (11/23/19 0855)  . remdesivir 100 mg in NS 100 mL Stopped (11/23/19 0905)   PRN Meds: sodium chloride, chlorpheniramine-HYDROcodone, guaiFENesin-dextromethorphan, morphine injection  Time spent: 35 minutes  Author: Berle Mull, MD [facility] 11/23/2019 3:29 PM  To reach On-call, see care teams to locate the attending and reach out via [email or web address]. Between 7PM-7AM, please contact night-coverage If you still have difficulty reaching the attending provider, please page the [facility] (Director on Call) for [facility] on amion for assistance.

## 2019-11-23 NOTE — Progress Notes (Signed)
Bilateral lower extremity venous duplex has been completed. Preliminary results can be found in CV Proc through chart review.   11/23/19 11:34 AM Olen Cordial RVT

## 2019-11-23 NOTE — Progress Notes (Signed)
  Echocardiogram 2D Echocardiogram has been performed.  Leta Jungling M 11/23/2019, 12:41 PM

## 2019-11-23 NOTE — Consult Note (Signed)
NAME:  Stuart Singh, MRN:  [id number], DOB:  05-13-1970, LOS: 1 ADMISSION DATE:  2019-11-22, CONSULTATION DATE:  10/11 REFERRING MD:  Dr. Ronaldo Miyamoto CHIEF COMPLAINT:  SOB, COVID PNA   Brief History   49 y/o M admitted with acute hypoxic respiratory failure with bilateral infiltrates in the setting of COVID PNA.   History of present illness   49 y/o M who presented to [facility] ER 10/10 with reports of shortness of breath.   The patient arrived via private vehicle and required assistance out of the car.  He was found to have room air saturations of 53%.  He reported on admit that he has been vaccinated for COVID.  He attended a funeral 2 weeks prior to admit and after the funeral, his aunt tested positive for COVID.  He reported on admit that he had been feeling short of breath for approximately two days but worsened on the day of presentation.  He was febrile to 103.3, hypoxic with saturations of 53%, and tachypneic.  COVID testing was positive. Initial labs notable for BUN 11/ Sr Cr 1.24, AST 77 / ALT 39, LDH 548, Ferritin 560, CRP 9.6, Fibrinogen 522, D-Dimer 0.69, Lactic Acid 3.0, PCT 0.12, WBC 7.2, HGb 14.2, platelets 200.  CXR demonstrated diffuse bilateral infiltrates.  He was admitted to [facility].    PCCM consulted 10/11 for evaluation.   Past Medical History    Significant Hospital Events   10/10 Admit with COVID PNA   Consults:     Procedures:     Significant Diagnostic Tests:    Micro Data:  COVID 10/10 >> positive  Influenza A/B 10/10 >> negative BCx2 10/10 >>  MRSA PCR 10/10 >> negative   Antimicrobials:    Interim history/subjective:   Placed on BiPAP yesterday for increased work of breathing. Not in respiratory distress.  Mental status is good  Objective   Blood pressure (!) 119/59, pulse 80, temperature 97.8 F (36.6 C), temperature source Oral, resp. rate (!) 29, height 5\' 8"  (1.727 m), weight 118 kg, SpO2 (!) 79 %.    Vent Mode: PSV FiO2 (%):  [90 %-100 %] 100  % PEEP:  [10 cmH20] 10 cmH20 Pressure Support:  [10 cmH20] 10 cmH20   Intake/Output Summary (Last 24 hours) at 11/23/2019 1041 Last data filed at 11/23/2019 0900 Gross per 24 hour  Intake 381.55 ml  Output 1530 ml  Net -1148.45 ml   Filed Weights   11-22-19 2148 11/22/19 0946  Weight: 117.9 kg 118 kg    Examination: Blood pressure (!) 119/59, pulse 80, temperature 97.8 F (36.6 C), temperature source Oral, resp. rate (!) 29, height 5\' 8"  (1.727 m), weight 118 kg, SpO2 (!) 79 %. Gen:      No acute distress HEENT:  EOMI, sclera anicteric Neck:     No masses; no thyromegaly Lungs:    Clear to auscultation bilaterally; normal respiratory effort CV:         Regular rate and rhythm; no murmurs Abd:      + bowel sounds; soft, non-tender; no palpable masses, no distension Ext:    No edema; adequate peripheral perfusion Skin:      Warm and dry; no rash Neuro: alert and oriented x 3 Psych: normal mood and affect  Chest x-ray 10/12-slight clearing of bilateral airspace disease.  Mild cardiomegaly.  Resolved Hospital Problem list      Assessment & Plan:   Acute Hypoxic Respiratory Failure in setting of Diffuse Bilateral Infiltrates secondary to COVID  PNA Pt started on decadron, remdesivir 10/10,  Actemra 10/11.  Reportedly vaccinated.  Unable to do prone positioning due to body habitus Will encourage him to lay on the side as much as possible Continue incentive spirometry Okay for trial of BiPAP.  He is at high risk for decompensation and needing intubation Can try repeat dose of Tocilizumab today given his BMI Continue steroids, remdesivir Lasix as needed for diuresis.  Best practice:  Diet: Regular, as tolerated  Pain/Anxiety/Delirium protocol (if indicated): n/a  VAP protocol (if indicated): n/a  DVT prophylaxis: Lovenox  GI prophylaxis: n/a Glucose control: per primary  Mobility: as tolerated Code Status: Full Code per patient Family Communication: Patient updated on  plan of care  Disposition: per primary   Critical care time:    The patient is critically ill with multiple organ system failure and requires high complexity decision making for assessment and support, frequent evaluation and titration of therapies, advanced monitoring, review of radiographic studies and interpretation of complex data.   Critical Care Time devoted to patient care services, exclusive of separately billable procedures, described in this note is 45 minutes.   Chilton Greathouse MD [facility] Please see [email or web address] for pager details.  11/23/2019, 10:46 AM

## 2019-11-24 DIAGNOSIS — J9600 Acute respiratory failure, unspecified whether with hypoxia or hypercapnia: Secondary | ICD-10-CM | POA: Diagnosis not present

## 2019-11-24 DIAGNOSIS — U071 COVID-19: Secondary | ICD-10-CM | POA: Diagnosis not present

## 2019-11-24 DIAGNOSIS — R0602 Shortness of breath: Secondary | ICD-10-CM

## 2019-11-24 DIAGNOSIS — J1282 Pneumonia due to Coronavirus disease 2019: Secondary | ICD-10-CM | POA: Diagnosis not present

## 2019-11-24 LAB — COMPREHENSIVE METABOLIC PANEL
ALT: 36 U/L (ref 0–44)
AST: 46 U/L — ABNORMAL HIGH (ref 15–41)
Albumin: 3 g/dL — ABNORMAL LOW (ref 3.5–5.0)
Alkaline Phosphatase: 78 U/L (ref 38–126)
Anion gap: 11 (ref 5–15)
BUN: 24 mg/dL — ABNORMAL HIGH (ref 6–20)
CO2: 24 mmol/L (ref 22–32)
Calcium: 8.3 mg/dL — ABNORMAL LOW (ref 8.9–10.3)
Chloride: 105 mmol/L (ref 98–111)
Creatinine, Ser: 0.98 mg/dL (ref 0.61–1.24)
GFR, Estimated: >60 mL/min (ref >60)
Glucose, Bld: 142 mg/dL — ABNORMAL HIGH (ref 70–99)
Potassium: 4.2 mmol/L (ref 3.5–5.1)
Sodium: 140 mmol/L (ref 135–145)
Total Bilirubin: 0.7 mg/dL (ref 0.3–1.2)
Total Protein: 7 g/dL (ref 6.5–8.1)

## 2019-11-24 LAB — CBC WITH DIFFERENTIAL/PLATELET
Abs Immature Granulocytes: 0.04 10*3/uL (ref 0.00–0.07)
Basophils Absolute: 0 10*3/uL (ref 0.0–0.1)
Basophils Relative: 0 %
Eosinophils Absolute: 0 10*3/uL (ref 0.0–0.5)
Eosinophils Relative: 0 %
HCT: 42.7 % (ref 39.0–52.0)
Hemoglobin: 14.2 g/dL (ref 13.0–17.0)
Immature Granulocytes: 1 %
Lymphocytes Relative: 12 %
Lymphs Abs: 0.6 10*3/uL — ABNORMAL LOW (ref 0.7–4.0)
MCH: 30.5 pg (ref 26.0–34.0)
MCHC: 33.3 g/dL (ref 30.0–36.0)
MCV: 91.6 fL (ref 80.0–100.0)
Monocytes Absolute: 0.2 10*3/uL (ref 0.1–1.0)
Monocytes Relative: 4 %
Neutro Abs: 4 10*3/uL (ref 1.7–7.7)
Neutrophils Relative %: 83 %
Platelets: 179 10*3/uL (ref 150–400)
RBC: 4.66 MIL/uL (ref 4.22–5.81)
RDW: 13.4 % (ref 11.5–15.5)
WBC: 4.8 10*3/uL (ref 4.0–10.5)
nRBC: 0 % (ref 0.0–0.2)

## 2019-11-24 LAB — GLUCOSE, CAPILLARY
Glucose-Capillary: 126 mg/dL — ABNORMAL HIGH (ref 70–99)
Glucose-Capillary: 145 mg/dL — ABNORMAL HIGH (ref 70–99)
Glucose-Capillary: 161 mg/dL — ABNORMAL HIGH (ref 70–99)
Glucose-Capillary: 151 mg/dL — ABNORMAL HIGH (ref 70–99)

## 2019-11-24 LAB — D-DIMER, QUANTITATIVE: D-Dimer, Quant: >20 ug/mL-FEU — ABNORMAL HIGH (ref 0.00–0.50)

## 2019-11-24 LAB — C-REACTIVE PROTEIN: CRP: 4.7 mg/dL — ABNORMAL HIGH (ref <1.0)

## 2019-11-24 LAB — FERRITIN: Ferritin: 715 ng/mL — ABNORMAL HIGH (ref 24–336)

## 2019-11-24 MED ORDER — ENOXAPARIN SODIUM 120 MG/0.8ML ~~LOC~~ SOLN
120.0000 mg | Freq: Two times a day (BID) | SUBCUTANEOUS | Status: DC
  Administered 2019-11-24 – 2019-12-06 (×26): 120 mg via SUBCUTANEOUS
  Filled 2019-11-24 (×27): qty 0.8

## 2019-11-24 NOTE — Progress Notes (Addendum)
NAME:  Stuart Singh, MRN:  [id number], DOB:  [birth year], LOS: 2 ADMISSION DATE:  12/05/2019, CONSULTATION DATE:  10/11 REFERRING MD:  Dr. Ronaldo Miyamoto CHIEF COMPLAINT:  SOB, COVID PNA   Brief History   49 y/o M admitted with acute hypoxic respiratory failure with bilateral infiltrates in the setting of COVID PNA.   History of present illness   50 y/o M who presented to [facility] ER 10/10 with reports of shortness of breath.   The patient arrived via private vehicle and required assistance out of the car.  He was found to have room air saturations of 53%.  He reported on admit that he has been vaccinated for COVID.  He attended a funeral 2 weeks prior to admit and after the funeral, his aunt tested positive for COVID.  He reported on admit that he had been feeling short of breath for approximately two days but worsened on the day of presentation.  He was febrile to 103.3, hypoxic with saturations of 53%, and tachypneic.  COVID testing was positive. Initial labs notable for BUN 11/ Sr Cr 1.24, AST 77 / ALT 39, LDH 548, Ferritin 560, CRP 9.6, Fibrinogen 522, D-Dimer 0.69, Lactic Acid 3.0, PCT 0.12, WBC 7.2, HGb 14.2, platelets 200.  CXR demonstrated diffuse bilateral infiltrates.  He was admitted to [facility].    PCCM consulted 10/11 for evaluation.   Past Medical History    Significant Hospital Events   10/10 Admit with COVID PNA   10/12 On bipap  Consults:     Procedures:     Significant Diagnostic Tests:    Micro Data:  COVID 10/10 >> positive  Influenza A/B 10/10 >> negative BCx2 10/10 >>  MRSA PCR 10/10 >> negative    Antimicrobials/COVID RX  Tociluzimab X 2- 10/11, 10/12 >> Remdesivir 10/12 >>  Solumedrol 10/12 >>  Interim history/subjective:   Continues on BiPAP  Objective   Blood pressure (!) 146/83, pulse 72, temperature 98 F (36.7 C), temperature source Axillary, resp. rate (!) 28, height 5\' 8"  (1.727 m), weight 118 kg, SpO2 91 %.    Vent Mode: BIPAP;PSV FiO2 (%):  [90 %-100  %] 90 % PEEP:  [8 cmH20-10 cmH20] 8 cmH20 Pressure Support:  [10 cmH20] 10 cmH20   Intake/Output Summary (Last 24 hours) at 11/24/2019 0802 Last data filed at 11/24/2019 11/26/2019 Gross per 24 hour  Intake 297.58 ml  Output 2200 ml  Net -1902.42 ml   Filed Weights   05-Dec-2019 2148 11/22/19 0946  Weight: 117.9 kg 118 kg    Examination: Gen:      No acute distress HEENT:  EOMI, sclera anicteric Neck:     No masses; no thyromegaly, ETT Lungs:    Clear to auscultation bilaterally; normal respiratory effort CV:         Regular rate and rhythm; no murmurs Abd:      + bowel sounds; soft, non-tender; no palpable masses, no distension Ext:    No edema; adequate peripheral perfusion Skin:      Warm and dry; no rash Neuro: alert and oriented x 3 Psych: Somnolent, arousable  No new imaging  Resolved Hospital Problem list      Assessment & Plan:   Acute Hypoxic Respiratory Failure in setting of Diffuse Bilateral Infiltrates secondary to COVID PNA Pt started on decadron, remdesivir 10/10,  Actemra 10/11, 10/12.  Reportedly vaccinated.  Unable to do prone positioning due to body habitus Will encourage him to lay on the side as much as possible  Continue incentive spirometry Will attempt to get him off BiPAP.  He is at high risk for decompensation and needing intubation Continue steroids, remdesivir Lasix as needed for diuresis.  Elevated D dimer D-dimer jumped to > 20 Has lower extremity duplex and echocardiogram which were unremarkable yesterday We will get CTA if he can tolerate going off the floor Otherwise consider empiric anticoagulation   Best practice:  Diet: Regular, as tolerated  Pain/Anxiety/Delirium protocol (if indicated): n/a  VAP protocol (if indicated): n/a  DVT prophylaxis: Lovenox  GI prophylaxis: n/a Glucose control: per primary  Mobility: as tolerated Code Status: Full Code per patient Family Communication: Per primary Disposition: per primary   Critical  care time:    The patient is critically ill with multiple organ system failure and requires high complexity decision making for assessment and support, frequent evaluation and titration of therapies, advanced monitoring, review of radiographic studies and interpretation of complex data.   Critical Care Time devoted to patient care services, exclusive of separately billable procedures, described in this note is 45 minutes.   Chilton Greathouse MD [facility] Please see [email or web address] for pager details.  11/24/2019, 8:02 AM

## 2019-11-24 NOTE — TOC Progression Note (Addendum)
Transition of Care [facility]) - Progression Note    Patient Details  Name: Stuart Singh MRN: [id number] Date of Birth: [birth year]  Transition of Care [facility]) CM/SW Contact  Leeroy Cha, RN Phone Number: 11/24/2019, 8:42 AM  Clinical Narrative:    1.  Acute hypoxic respiratory failure POA. Acute COVID-19 Viral Pneumonia Sepsis POA.  Secondary to Covid 19 infection CXR: hazy bilateral peripheral opacities Met SIRS criteria with temperature 39.6, HR 109, RR 50 on admission. Oxygen requirement: 53% on room air.  Currently on BiPAP 100%.  ER 100% FiO2 on BiPAP 76 to 85% of saturation. Unable to tolerate 100 to 90% reduction in FiO2. Unable to tolerate morphine without significant drop in saturation. CRP/101321=4.7 Remdesivir: Started on 11/20/2019 Steroids: Started on Decadron on 11/24/2019.  Switch to Solu-Medrol on 11/23/2019. Baricitinib/Actemra(off-label use): Received Tocilizumab 800 mg on 11/22/2019 as well as on 11/23/2019. The investigational nature of this medication was discussed with the patient/HCPOA and they choose to proceed as the potential benefits are felt to outweigh risks at this time.  Antibiotics: No medication for now. Vitamin C and Zinc: Continue DVT Prophylaxis:  0.5 mg/kg per 24 hours. Prone positioning and incentive spirometer use recommended. D.dimer >20.00 On bi-pap at 90%. Expected Discharge Plan: [place] Barriers to Discharge: Barriers Unresolved (comment)  Expected Discharge Plan and Services Expected Discharge Plan: [place]   Discharge Planning Services: CM Consult   Living arrangements for the past 2 months: Single Family Home                                       Social Determinants of Health (SDOH) Interventions    Readmission Risk Interventions No flowsheet data found.

## 2019-11-24 NOTE — Progress Notes (Signed)
ANTICOAGULATION CONSULT NOTE   Pharmacy Consult for lovenox Indication: r/o pulmonary embolus  Allergies  Allergen Reactions  . Codeine Rash  . Anacin-3 [Acetaminophen]     Tylenol 3    Patient Measurements: Height: 5\' 8"  (172.7 cm) Weight: 118 kg (260 lb 2.3 oz) IBW/kg (Calculated) : 68.4 Heparin Dosing Weight:   Vital Signs: Temp: 98 F (36.7 C) (10/13 0342) Temp Source: Axillary (10/13 0342) BP: 146/83 (10/13 0600) Pulse Rate: 72 (10/13 0600)  Labs: Recent Labs    11/22/19 1113 11/22/19 1113 11/23/19 0257 11/24/19 0252  HGB 13.3   < > 13.4 14.2  HCT 40.3  --  40.8 42.7  PLT 170  --  202 179  CREATININE 1.04  --  0.95 0.98   < > = values in this interval not displayed.    Estimated Creatinine Clearance: 111.3 mL/min (by C-G formula based on SCr of 0.98 mg/dL).   Assessment: Patient is a 49 y.o M presented to the ED on 10/10 with c/o SOB and subsequently tested positive for COVID-19.  He was started on LMWH prophylaxis dose on admission.  Ddimer increased to >20 on 10/13.  Pharmacy is consulted to increase LMWH to full dose on 10/13 for r/o PE.  - 10/12 LE doppler: neg for DVT   Goal of Therapy:  Anti-Xa level 0.6-1 units/ml 4hrs after LMWH dose given Monitor platelets by anticoagulation protocol: Yes   Plan:  - increase lovenox dose to 120 mg SQ q12h - f/u chest CTA results if able to transport patient down for procedure  Ota Ebersole P 11/24/2019,9:48 AM

## 2019-11-24 NOTE — Progress Notes (Signed)
PROGRESS NOTE  Stuart Singh ZOX:[id number] DOB: [birth year] DOA: 12-10-19 PCP: Patient, No Pcp Per   LOS: 2 days   Brief Narrative / Interim history: 49 year old male with history of obesity, reported vaccination for Covid on admission, admitted to the hospital with respiratory failure, chest x-ray showed bilateral infiltrates and he was positive for Covid 19.  He apparently attended the funeral 2 weeks prior to admit and has been exposed there.  He was short of breath for couple of days but suddenly gotten worse the day of admission.  He was 53% on room air requiring supplemental oxygen.  He has been admitted to stepdown, critical care was consulted  Subjective / 24h Interval events: On BiPAP, prone, overall appears comfortable.  Denies shortness of breath at rest and when he is sitting on his abdomen.  Assessment & Plan:  Principal Problem Acute Hypoxic Respiratory Failure due to Covid-19 Viral Illness, sepsis ruled in -Patient is severely hypoxemic, currently requiring BiPAP with high percent FiO2.  Critical care consulted and following.  Vent Mode: PSV;BIPAP FiO2 (%):  [90 %-100 %] 100 % PEEP:  [8 cmH20-10 cmH20] 8 cmH20 Pressure Support:  [10 cmH20] 10 cmH20   -Continue to monitor inflammatory markers, D-dimer jumped to greater than 20 today, patient too unstable to get a CT angiogram but highly suspect PE.  Start empiric anticoagulation, discussed with critical care. -Started on remdesivir on 10/10, continue steroids, received Actemra x2 on 10/11 and 10/12. -Continue supportive treatment -OK with mild hypoxemia, goal at rest is > 85% SaO2, with movement ideally > 75%  COVID-19 Labs  Recent Labs    12/10/19 2100 12/10/2019 2100 11/22/19 1113 11/23/19 0257 11/24/19 0252  DDIMER 0.69*  --   --  2.91* >20.00*  FERRITIN 560*   < > 529* 853* 715*  LDH 548*  --   --   --   --   CRP 9.6*   < > 11.8* 9.0* 4.7*   < > = values in this interval not displayed.    Lab Results    Component Value Date   SARSCOV2NAA POSITIVE (A) 12/10/2019   Active Problems Elevated LFTs -Likely in the setting of Covid infection, trend, stable  Obesity, BMI 39 -Patient will benefit from weight loss   Scheduled Meds: . albuterol  6 puff Inhalation Q6H  . vitamin C  500 mg Oral Daily  . chlorhexidine  15 mL Mouth Rinse BID  . Chlorhexidine Gluconate Cloth  6 each Topical Daily  . enoxaparin (LOVENOX) injection  120 mg Subcutaneous Q12H  . furosemide  20 mg Intravenous BID  . insulin aspart  0-15 Units Subcutaneous TID WC  . insulin aspart  0-5 Units Subcutaneous QHS  . insulin aspart  3 Units Subcutaneous TID WC  . linagliptin  5 mg Oral Daily  . mouth rinse  15 mL Mouth Rinse q12n4p  . methylPREDNISolone (SOLU-MEDROL) injection  120 mg Intravenous Q12H  . nystatin  5 mL Oral QID  . zinc sulfate  220 mg Oral Daily   Continuous Infusions: . sodium chloride Stopped (11/23/19 0855)  . remdesivir 100 mg in NS 100 mL Stopped (11/23/19 0905)   PRN Meds:.sodium chloride, chlorpheniramine-HYDROcodone, guaiFENesin-dextromethorphan, morphine injection  DVT prophylaxis: Lovenox Code Status: Full code Family Communication: no family at bedside    Status is: Inpatient  Remains inpatient appropriate because:Inpatient level of care appropriate due to severity of illness   Dispo: The patient is from: Home  Anticipated d/c is to: Home              Anticipated d/c date is: > 3 days              Patient currently is not medically stable to d/c.  Consultants:  PCCM  Procedures:  2D echo:  1. Left ventricular ejection fraction, by estimation, is 60 to 65%. The left ventricle has normal function. The left ventricle has no regional wall motion abnormalities. Left ventricular diastolic parameters were normal.  2. Right ventricular systolic function is normal. The right ventricular size is normal.  3. The mitral valve is normal in structure. No evidence of mitral  valve regurgitation. No evidence of mitral stenosis.  4. The aortic valve is normal in structure. Aortic valve regurgitation is not visualized. No aortic stenosis is present.  5. The inferior vena cava is normal in size with greater than 50% respiratory variability, suggesting right atrial pressure of 3 mmHg.   Microbiology: None   Antibacterials: None    Objective: Vitals:   11/24/19 0600 11/24/19 0800 11/24/19 0900 11/24/19 1000  BP: (!) 146/83 138/64 140/82 (!) 138/93  Pulse: 72 71 64 72  Resp: (!) 28 (!) 28 (!) 29 (!) 26  Temp:  (!) 97.2 F (36.2 C)    TempSrc:  Axillary    SpO2: 91% (!) 87% (!) 89% (!) 84%  Weight:      Height:        Intake/Output Summary (Last 24 hours) at 11/24/2019 1043 Last data filed at 11/24/2019 9528 Gross per 24 hour  Intake 118.31 ml  Output 2200 ml  Net -2081.69 ml   Filed Weights   12/01/2019 2148 11/22/19 0946  Weight: 117.9 kg 118 kg    Examination:  Constitutional: Prone, tachypneic but no apparent distress Eyes: no scleral icterus Neck: normal, supple Respiratory: Coarse breath sounds bilaterally, no wheezing, breathing with the BiPAP, increased respiratory effort Cardiovascular: Regular rate and rhythm, no murmurs / rubs / gallops. No LE edema. Abdomen: non distended, no tenderness. Bowel sounds positive.  Musculoskeletal: no clubbing / cyanosis.  Skin: no rashes Neurologic: Nonfocal, equal strength  Data Reviewed: I have independently reviewed following labs and imaging studies   CBC: Recent Labs  Lab 11/17/2019 2100 11/22/19 1113 11/23/19 0257 11/24/19 0252  WBC 7.2 4.0 3.4* 4.8  NEUTROABS 5.2 3.3 2.3 4.0  HGB 14.2 13.3 13.4 14.2  HCT 42.3 40.3 40.8 42.7  MCV 90.6 92.9 93.8 91.6  PLT 200 170 202 179   Basic Metabolic Panel: Recent Labs  Lab 11/15/2019 2100 11/22/19 1113 11/23/19 0257 11/24/19 0252  NA 136 140 144 140  K 3.7 4.4 5.1 4.2  CL 99 102 105 105  CO2 24 25 26 24   GLUCOSE 139* 127* 130* 142*  BUN  11 12 15  24*  CREATININE 1.24 1.04 0.95 0.98  CALCIUM 7.9* 8.3* 8.7* 8.3*   GFR: Estimated Creatinine Clearance: 111.3 mL/min (by C-G formula based on SCr of 0.98 mg/dL). Liver Function Tests: Recent Labs  Lab 11/29/2019 2100 11/22/19 1113 11/23/19 0257 11/24/19 0252  AST 77* 64* 64* 46*  ALT 39 38 38 36  ALKPHOS 76 73 72 78  BILITOT 0.4 0.7 0.7 0.7  PROT 7.2 7.1 7.2 7.0  ALBUMIN 3.1* 3.0* 3.0* 3.0*   No results for input(s): LIPASE, AMYLASE in the last 168 hours. No results for input(s): AMMONIA in the last 168 hours. Coagulation Profile: No results for input(s): INR, PROTIME in the last 168  hours. Cardiac Enzymes: No results for input(s): CKTOTAL, CKMB, CKMBINDEX, TROPONINI in the last 168 hours. BNP (last 3 results) No results for input(s): PROBNP in the last 8760 hours. HbA1C: No results for input(s): HGBA1C in the last 72 hours. CBG: Recent Labs  Lab 11/23/19 0744 11/23/19 1126 11/23/19 1626 11/23/19 2125 11/24/19 0744  GLUCAP 119* 131* 141* 141* 126*   Lipid Profile: Recent Labs    12/01/2019 2100  TRIG 148   Thyroid Function Tests: No results for input(s): TSH, T4TOTAL, FREET4, T3FREE, THYROIDAB in the last 72 hours. Anemia Panel: Recent Labs    11/23/19 0257 11/24/19 0252  FERRITIN 853* 715*   Urine analysis: No results found for: COLORURINE, APPEARANCEUR, LABSPEC, PHURINE, GLUCOSEU, HGBUR, BILIRUBINUR, KETONESUR, PROTEINUR, UROBILINOGEN, NITRITE, LEUKOCYTESUR Sepsis Labs: Invalid input(s): PROCALCITONIN, LACTICIDVEN  Recent Results (from the past 240 hour(s))  Respiratory Panel by RT PCR (Flu A&B, Covid) - Nasopharyngeal Swab     Status: Abnormal   Collection Time: 11/29/2019  9:00 PM   Specimen: Nasopharyngeal Swab  Result Value Ref Range Status   SARS Coronavirus 2 by RT PCR POSITIVE (A) NEGATIVE Final    Comment: RESULT CALLED TO, READ BACK BY AND VERIFIED WITH: MAYNARD,C AT 2154 ON 11/12/2019 BY CHERESNOWSKY,T (NOTE) SARS-CoV-2 target nucleic  acids are DETECTED.  SARS-CoV-2 RNA is generally detectable in upper respiratory specimens  during the acute phase of infection. Positive results are indicative of the presence of the identified virus, but do not rule out bacterial infection or co-infection with other pathogens not detected by the test. Clinical correlation with patient history and other diagnostic information is necessary to determine patient infection status. The expected result is Negative.  Fact Sheet for Patients:  https://www.moore.com/  Fact Sheet for Healthcare Providers: https://www.young.biz/  This test is not yet approved or cleared by the [place] FDA and  has been authorized for detection and/or diagnosis of SARS-CoV-2 by FDA under an Emergency Use Authorization (EUA).  This EUA will remain in effect (meaning this t est can be used) for the duration of  the COVID-19 declaration under Section 564(b)(1) of the Act, 21 U.S.C. section 360bbb-3(b)(1), unless the authorization is terminated or revoked sooner.      Influenza A by PCR NEGATIVE NEGATIVE Final   Influenza B by PCR NEGATIVE NEGATIVE Final    Comment: (NOTE) The Xpert Xpress SARS-CoV-2/FLU/RSV assay is intended as an aid in  the diagnosis of influenza from Nasopharyngeal swab specimens and  should not be used as a sole basis for treatment. Nasal washings and  aspirates are unacceptable for Xpert Xpress SARS-CoV-2/FLU/RSV  testing.  Fact Sheet for Patients: https://www.moore.com/  Fact Sheet for Healthcare Providers: https://www.young.biz/  This test is not yet approved or cleared by the [place] FDA and  has been authorized for detection and/or diagnosis of SARS-CoV-2 by  FDA under an Emergency Use Authorization (EUA). This EUA will remain  in effect (meaning this test can be used) for the duration of the  Covid-19 declaration under Section 564(b)(1) of  the Act, 21  U.S.C. section 360bbb-3(b)(1), unless the authorization is  terminated or revoked. Performed at [facility], [address] [place], [place] [address]   Blood Culture (routine x 2)     Status: None (Preliminary result)   Collection Time: 11/30/2019  9:00 PM   Specimen: BLOOD  Result Value Ref Range Status   Specimen Description   Final    BLOOD RIGHT ANTECUBITAL Performed at [facility],  [address] [place], [place] [address]    Special Requests   Final    BOTTLES DRAWN AEROBIC AND ANAEROBIC Blood Culture adequate volume Performed at [facility], [address] [place], [place] [address]    Culture   Final    NO GROWTH 2 DAYS Performed at [facility], [address], [place], [place] [address]    Report Status PENDING  Incomplete  Blood Culture (routine x 2)     Status: None (Preliminary result)   Collection Time: 11/27/2019  9:05 PM   Specimen: BLOOD  Result Value Ref Range Status   Specimen Description   Final    BLOOD LEFT ANTECUBITAL Performed at [facility], [address] [place], [place] [address]    Special Requests   Final    BOTTLES DRAWN AEROBIC AND ANAEROBIC Blood Culture adequate volume Performed at [facility], [address] [place], [place] [address]    Culture   Final    NO GROWTH 2 DAYS Performed at [facility], [address], [place], [place] [address]    Report Status PENDING  Incomplete  MRSA PCR Screening     Status: None   Collection Time: 11/22/19 10:11 AM   Specimen: Nasal Mucosa; Nasopharyngeal  Result Value Ref Range Status   MRSA by PCR NEGATIVE NEGATIVE Final    Comment:        The GeneXpert MRSA Assay (FDA approved for NASAL specimens only), is one component of a comprehensive MRSA colonization surveillance program. It is not intended to diagnose MRSA infection nor to guide or monitor treatment for MRSA infections. Performed at [facility], [address] [place], [place] [address]       Radiology Studies: DG CHEST PORT 1 VIEW  Result Date: 11/23/2019 CLINICAL DATA:  Shortness of breath.  COVID-19 positive EXAM: PORTABLE CHEST 1 VIEW COMPARISON:  November 21, 2019 FINDINGS: There is patchy airspace opacity in the lungs bilaterally with a slight degree of clearing bilaterally compared to 2 days prior. No new opacity evident. Heart is mildly enlarged with pulmonary vascularity normal. No adenopathy. No bone lesions. IMPRESSION: Multifocal airspace opacity consistent with atypical organism pneumonia. Slight clearing bilaterally compared to recent study. No new opacity evident. Mild cardiomegaly. No adenopathy. Electronically Signed   By: Bretta Bang III M.D.   On: 11/23/2019 08:59   ECHOCARDIOGRAM COMPLETE  Result Date: 11/23/2019    ECHOCARDIOGRAM REPORT   Patient Name:   Stuart Singh Date of Exam: 11/23/2019 Medical Rec #:  [id number]     Height:       68.0 in Accession #:    [id number]    Weight:       260.1 lb Date of Birth:  23-Aug-1970     BSA:          2.285 m Patient Age:    49 years      BP:           119/59 mmHg Patient Gender: M             HR:           80 bpm. Exam Location:  Inpatient Procedure: 2D Echo Indications:    Cardiomegaly 429.3 / I51.7  History:        Patient has no prior history of Echocardiogram examinations.  COVID 19.  Sonographer:    Leta Jungling RDCS Referring Phys: [id number] [facility] M PATEL IMPRESSIONS  1. Left ventricular ejection fraction, by estimation, is 60 to 65%. The left ventricle has normal function. The left ventricle has no regional wall motion abnormalities. Left ventricular diastolic parameters were normal.  2. Right ventricular systolic function is normal. The right ventricular size is normal.  3. The mitral valve is normal in structure. No evidence of mitral valve regurgitation. No evidence of mitral stenosis.  4. The aortic valve is normal in structure.  Aortic valve regurgitation is not visualized. No aortic stenosis is present.  5. The inferior vena cava is normal in size with greater than 50% respiratory variability, suggesting right atrial pressure of 3 mmHg. FINDINGS  Left Ventricle: Left ventricular ejection fraction, by estimation, is 60 to 65%. The left ventricle has normal function. The left ventricle has no regional wall motion abnormalities. The left ventricular internal cavity size was normal in size. There is  no left ventricular hypertrophy. Left ventricular diastolic parameters were normal. Normal left ventricular filling pressure. Right Ventricle: The right ventricular size is normal. No increase in right ventricular wall thickness. Right ventricular systolic function is normal. Left Atrium: Left atrial size was normal in size. Right Atrium: Right atrial size was normal in size. Pericardium: There is no evidence of pericardial effusion. Mitral Valve: The mitral valve is normal in structure. No evidence of mitral valve regurgitation. No evidence of mitral valve stenosis. Tricuspid Valve: The tricuspid valve is normal in structure. Tricuspid valve regurgitation is not demonstrated. No evidence of tricuspid stenosis. Aortic Valve: The aortic valve is normal in structure. Aortic valve regurgitation is not visualized. No aortic stenosis is present. Pulmonic Valve: The pulmonic valve was normal in structure. Pulmonic valve regurgitation is not visualized. No evidence of pulmonic stenosis. Aorta: The aortic root is normal in size and structure. Venous: The inferior vena cava is normal in size with greater than 50% respiratory variability, suggesting right atrial pressure of 3 mmHg. IAS/Shunts: No atrial level shunt detected by color flow Doppler.  LEFT VENTRICLE PLAX 2D LVIDd:         4.27 cm  Diastology LVIDs:         2.74 cm  LV e' medial:    6.96 cm/s LV PW:         0.99 cm  LV E/e' medial:  8.9 LV IVS:        1.14 cm  LV e' lateral:   8.16 cm/s LVOT  diam:     2.00 cm  LV E/e' lateral: 7.6 LV SV:         52 LV SV Index:   23 LVOT Area:     3.14 cm  LEFT ATRIUM             Index LA diam:        2.30 cm 1.01 cm/m LA Vol (A2C):   41.4 ml 18.09 ml/m LA Vol (A4C):   22.8 ml 9.98 ml/m LA Biplane Vol: 29.2 ml 12.78 ml/m  AORTIC VALVE LVOT Vmax:   99.20 cm/s LVOT Vmean:  65.800 cm/s LVOT VTI:    0.164 m  AORTA Ao Root diam: 3.70 cm MITRAL VALVE MV Area (PHT): 4.68 cm    SHUNTS MV Decel Time: 162 msec    Systemic VTI:  0.16 m MV E velocity: 61.80 cm/s  Systemic Diam: 2.00 cm MV A velocity: 47.70 cm/s MV E/A ratio:  1.30 Mihai Croitoru MD Electronically signed  by Mihai Croitoru MThurmon Fair Signature Date/Time: 11/23/2019/1:40:14 PM    Final    VAS US LOWER EXTREMITY VENOUS (DVT)  Result Date: 11/23/2019  Lower Venous DVT Study Indications: Edema.  Risk Factors: COVID 19 positive. Limitations: Poor ultrasound/tissue interface. Comparison Study: No prior studies. Performing Technologist: Chanda BusingGregory Collins RVT  Examination Guidelines: A complete evaluation includes B-mode imaging, spectral Doppler, color Doppler, and power Doppler as needed of all accessible portions of each vessel. Bilateral testing is considered an integral part of a complete examination. Limited examinations for reoccurring indications may be performed as noted. The reflux portion of the exam is performed with the patient in reverse Trendelenburg.  +---------+---------------+---------+-----------+----------+--------------+ RIGHT    CompressibilityPhasicitySpontaneityPropertiesThrombus Aging +---------+---------------+---------+-----------+----------+--------------+ CFV      Full           Yes      Yes                                 +---------+---------------+---------+-----------+----------+--------------+ SFJ      Full                                                        +---------+---------------+---------+-----------+----------+--------------+ FV Prox  Full                                                         +---------+---------------+---------+-----------+----------+--------------+ FV Mid   Full                                                        +---------+---------------+---------+-----------+----------+--------------+ FV DistalFull                                                        +---------+---------------+---------+-----------+----------+--------------+ PFV      Full                                                        +---------+---------------+---------+-----------+----------+--------------+ POP      Full           Yes      Yes                                 +---------+---------------+---------+-----------+----------+--------------+ PTV      Full                                                        +---------+---------------+---------+-----------+----------+--------------+  PERO     Full                                                        +---------+---------------+---------+-----------+----------+--------------+   +---------+---------------+---------+-----------+----------+--------------+ LEFT     CompressibilityPhasicitySpontaneityPropertiesThrombus Aging +---------+---------------+---------+-----------+----------+--------------+ CFV      Full           Yes      Yes                                 +---------+---------------+---------+-----------+----------+--------------+ SFJ      Full                                                        +---------+---------------+---------+-----------+----------+--------------+ FV Prox  Full                                                        +---------+---------------+---------+-----------+----------+--------------+ FV Mid   Full                                                        +---------+---------------+---------+-----------+----------+--------------+ FV DistalFull                                                         +---------+---------------+---------+-----------+----------+--------------+ PFV      Full                                                        +---------+---------------+---------+-----------+----------+--------------+ POP      Full           Yes      Yes                                 +---------+---------------+---------+-----------+----------+--------------+ PTV      Full                                                        +---------+---------------+---------+-----------+----------+--------------+ PERO  Not visualized +---------+---------------+---------+-----------+----------+--------------+     Summary: RIGHT: - There is no evidence of deep vein thrombosis in the lower extremity.  - No cystic structure found in the popliteal fossa.  LEFT: - There is no evidence of deep vein thrombosis in the lower extremity. However, portions of this examination were limited- see technologist comments above.  - No cystic structure found in the popliteal fossa.  *See table(s) above for measurements and observations. Electronically signed by Waverly Ferrari MD on 11/23/2019 at 2:18:43 PM.    Final      Pamella Pert, MD, PhD [facility]  Between 7 am - 7 pm I am available, please contact me via Amion or Securechat  Between 7 pm - 7 am I am not available, please contact night coverage MD/APP via Amion

## 2019-11-25 ENCOUNTER — Inpatient Hospital Stay (HOSPITAL_COMMUNITY): Payer: BC Managed Care – PPO

## 2019-11-25 DIAGNOSIS — J1282 Pneumonia due to Coronavirus disease 2019: Secondary | ICD-10-CM | POA: Diagnosis not present

## 2019-11-25 DIAGNOSIS — U071 COVID-19: Secondary | ICD-10-CM | POA: Diagnosis not present

## 2019-11-25 DIAGNOSIS — J9600 Acute respiratory failure, unspecified whether with hypoxia or hypercapnia: Secondary | ICD-10-CM | POA: Diagnosis not present

## 2019-11-25 DIAGNOSIS — R0602 Shortness of breath: Secondary | ICD-10-CM | POA: Diagnosis not present

## 2019-11-25 LAB — COMPREHENSIVE METABOLIC PANEL
ALT: 46 U/L — ABNORMAL HIGH (ref 0–44)
AST: 47 U/L — ABNORMAL HIGH (ref 15–41)
Albumin: 3.2 g/dL — ABNORMAL LOW (ref 3.5–5.0)
Alkaline Phosphatase: 82 U/L (ref 38–126)
Anion gap: 14 (ref 5–15)
BUN: 29 mg/dL — ABNORMAL HIGH (ref 6–20)
CO2: 26 mmol/L (ref 22–32)
Calcium: 8.7 mg/dL — ABNORMAL LOW (ref 8.9–10.3)
Chloride: 106 mmol/L (ref 98–111)
Creatinine, Ser: 1.04 mg/dL (ref 0.61–1.24)
GFR, Estimated: >60 mL/min (ref >60)
Glucose, Bld: 153 mg/dL — ABNORMAL HIGH (ref 70–99)
Potassium: 4.5 mmol/L (ref 3.5–5.1)
Sodium: 146 mmol/L — ABNORMAL HIGH (ref 135–145)
Total Bilirubin: 0.8 mg/dL (ref 0.3–1.2)
Total Protein: 7.4 g/dL (ref 6.5–8.1)

## 2019-11-25 LAB — CBC WITH DIFFERENTIAL/PLATELET
Abs Immature Granulocytes: 0.15 10*3/uL — ABNORMAL HIGH (ref 0.00–0.07)
Basophils Absolute: 0 10*3/uL (ref 0.0–0.1)
Basophils Relative: 0 %
Eosinophils Absolute: 0 10*3/uL (ref 0.0–0.5)
Eosinophils Relative: 0 %
HCT: 44.6 % (ref 39.0–52.0)
Hemoglobin: 15.1 g/dL (ref 13.0–17.0)
Immature Granulocytes: 2 %
Lymphocytes Relative: 9 %
Lymphs Abs: 0.9 10*3/uL (ref 0.7–4.0)
MCH: 31 pg (ref 26.0–34.0)
MCHC: 33.9 g/dL (ref 30.0–36.0)
MCV: 91.6 fL (ref 80.0–100.0)
Monocytes Absolute: 0.3 10*3/uL (ref 0.1–1.0)
Monocytes Relative: 3 %
Neutro Abs: 8.7 10*3/uL — ABNORMAL HIGH (ref 1.7–7.7)
Neutrophils Relative %: 86 %
Platelets: 220 10*3/uL (ref 150–400)
RBC: 4.87 MIL/uL (ref 4.22–5.81)
RDW: 13.2 % (ref 11.5–15.5)
WBC: 10 10*3/uL (ref 4.0–10.5)
nRBC: 0 % (ref 0.0–0.2)

## 2019-11-25 LAB — GLUCOSE, CAPILLARY
Glucose-Capillary: 133 mg/dL — ABNORMAL HIGH (ref 70–99)
Glucose-Capillary: 154 mg/dL — ABNORMAL HIGH (ref 70–99)
Glucose-Capillary: 180 mg/dL — ABNORMAL HIGH (ref 70–99)
Glucose-Capillary: 145 mg/dL — ABNORMAL HIGH (ref 70–99)

## 2019-11-25 LAB — C-REACTIVE PROTEIN: CRP: 2.2 mg/dL — ABNORMAL HIGH (ref <1.0)

## 2019-11-25 LAB — FERRITIN: Ferritin: 546 ng/mL — ABNORMAL HIGH (ref 24–336)

## 2019-11-25 LAB — D-DIMER, QUANTITATIVE: D-Dimer, Quant: >20 ug/mL-FEU — ABNORMAL HIGH (ref 0.00–0.50)

## 2019-11-25 MED ORDER — SENNA 8.6 MG PO TABS
1.0000 | ORAL_TABLET | Freq: Every day | ORAL | Status: DC
  Administered 2019-11-26 – 2019-12-08 (×9): 8.6 mg via ORAL
  Filled 2019-11-25 (×11): qty 1

## 2019-11-25 MED ORDER — DOCUSATE SODIUM 100 MG PO CAPS
100.0000 mg | ORAL_CAPSULE | Freq: Every day | ORAL | Status: DC
  Administered 2019-11-25 – 2019-12-01 (×7): 100 mg via ORAL
  Filled 2019-11-25 (×9): qty 1

## 2019-11-25 MED ORDER — POLYETHYLENE GLYCOL 3350 17 G PO PACK
17.0000 g | PACK | Freq: Every day | ORAL | Status: DC
  Administered 2019-11-25 – 2019-12-06 (×8): 17 g via ORAL
  Filled 2019-11-25 (×11): qty 1

## 2019-11-25 NOTE — Progress Notes (Signed)
Replaced water on heated high flow 02 system- uneventful.

## 2019-11-25 NOTE — Progress Notes (Signed)
PROGRESS NOTE  Stuart Singh HWE:[id number] DOB: 15-Sep-1970 DOA: 12/01/2019 PCP: Patient, No Pcp Per   LOS: 3 days   Brief Narrative / Interim history: 49 year old male with history of obesity, reported vaccination for Covid on admission, admitted to the hospital with respiratory failure, chest x-ray showed bilateral infiltrates and he was positive for Covid 19.  He apparently attended the funeral 2 weeks prior to admit and has been exposed there.  He was short of breath for couple of days but suddenly gotten worse the day of admission.  He was 53% on room air requiring supplemental oxygen.  He has been admitted to stepdown, critical care was consulted  Subjective / 24h Interval events: On BiPAP this morning, he is supine in bed and appears comfortable.  Alert, tells me not feel short of breath with the BiPAP on.  Assessment & Plan:  Principal Problem Acute Hypoxic Respiratory Failure due to Covid-19 Viral Illness, sepsis ruled in -Remains severely hypoxemic 100% FiO2 on BiPAP.  He was on nonrebreather yesterday and tolerated that well but became short of breath and very tired having to hold back on BiPAP  Vent Mode: BIPAP;PSV FiO2 (%):  [90 %-100 %] 100 % Set Rate:  [12 bmp] 12 bmp PEEP:  [8 cmH20] 8 cmH20 Pressure Support:  [10 cmH20] 10 cmH20   -Continue to monitor inflammatory markers, D-dimer jumped to greater than 20/13, patient too unstable to get a CT angiogram but highly suspect PE.  Started empiric anticoagulation, discussed with critical care. -Started on remdesivir on 10/10, continue steroids, received Actemra x2 on 10/11 and 10/12. -Continue supportive treatment -OK with mild hypoxemia, goal at rest is > 85% SaO2, with movement ideally > 75%  COVID-19 Labs  Recent Labs    11/23/19 0257 11/24/19 0252 11/25/19 0309  DDIMER 2.91* >20.00* >20.00*  FERRITIN 853* 715* 546*  CRP 9.0* 4.7* 2.2*    Lab Results  Component Value Date   SARSCOV2NAA POSITIVE (A) 11/20/2019    Active Problems Elevated LFTs -Likely in the setting of Covid infection, trend, stable  Obesity, BMI 39 -Patient will benefit from weight loss   Scheduled Meds: . albuterol  6 puff Inhalation Q6H  . vitamin C  500 mg Oral Daily  . chlorhexidine  15 mL Mouth Rinse BID  . Chlorhexidine Gluconate Cloth  6 each Topical Daily  . docusate sodium  100 mg Oral Daily  . enoxaparin (LOVENOX) injection  120 mg Subcutaneous Q12H  . furosemide  20 mg Intravenous BID  . insulin aspart  0-15 Units Subcutaneous TID WC  . insulin aspart  0-5 Units Subcutaneous QHS  . insulin aspart  3 Units Subcutaneous TID WC  . linagliptin  5 mg Oral Daily  . mouth rinse  15 mL Mouth Rinse q12n4p  . methylPREDNISolone (SOLU-MEDROL) injection  120 mg Intravenous Q12H  . nystatin  5 mL Oral QID  . zinc sulfate  220 mg Oral Daily   Continuous Infusions: . sodium chloride Stopped (11/23/19 0855)   PRN Meds:.sodium chloride, chlorpheniramine-HYDROcodone, guaiFENesin-dextromethorphan, morphine injection  DVT prophylaxis: Lovenox Code Status: Full code Family Communication: no family at bedside   Status is: Inpatient  Remains inpatient appropriate because:Inpatient level of care appropriate due to severity of illness   Dispo: The patient is from: Home              Anticipated d/c is to: Home              Anticipated d/c date is: > 3  days              Patient currently is not medically stable to d/c.  Consultants:  PCCM  Procedures:  2D echo:  1. Left ventricular ejection fraction, by estimation, is 60 to 65%. The left ventricle has normal function. The left ventricle has no regional wall motion abnormalities. Left ventricular diastolic parameters were normal.  2. Right ventricular systolic function is normal. The right ventricular size is normal.  3. The mitral valve is normal in structure. No evidence of mitral valve regurgitation. No evidence of mitral stenosis.  4. The aortic valve is normal  in structure. Aortic valve regurgitation is not visualized. No aortic stenosis is present.  5. The inferior vena cava is normal in size with greater than 50% respiratory variability, suggesting right atrial pressure of 3 mmHg.   Microbiology: None   Antibacterials: None    Objective: Vitals:   11/25/19 0846 11/25/19 0853 11/25/19 0900 11/25/19 1000  BP:    132/67  Pulse:  83 83 90  Resp:  (!) 32 (!) 30 (!) 34  Temp:      TempSrc:      SpO2: 94% 95% 95% 97%  Weight:      Height:        Intake/Output Summary (Last 24 hours) at 11/25/2019 1153 Last data filed at 11/25/2019 1027 Gross per 24 hour  Intake 195.67 ml  Output 1650 ml  Net -1454.33 ml   Filed Weights   2019-12-10 2148 11/22/19 0946  Weight: 117.9 kg 118 kg    Examination:  Constitutional: Laying in bed, BiPAP on, overall appears comfortable Eyes: No scleral icterus Neck: normal, supple Respiratory: Coarse breath sounds bilaterally, breathing with the BiPAP, no wheezing Cardiovascular: Regular rate and rhythm, no murmurs, no peripheral edema Abdomen: Soft, nontender, nondistended, bowel sounds positive Musculoskeletal: no clubbing / cyanosis.  Skin: No rashes appreciated Neurologic: No focal deficits, equal strength  Data Reviewed: I have independently reviewed following labs and imaging studies   CBC: Recent Labs  Lab December 10, 2019 2100 11/22/19 1113 11/23/19 0257 11/24/19 0252 11/25/19 0309  WBC 7.2 4.0 3.4* 4.8 10.0  NEUTROABS 5.2 3.3 2.3 4.0 8.7*  HGB 14.2 13.3 13.4 14.2 15.1  HCT 42.3 40.3 40.8 42.7 44.6  MCV 90.6 92.9 93.8 91.6 91.6  PLT 200 170 202 179 220   Basic Metabolic Panel: Recent Labs  Lab 12/10/19 2100 11/22/19 1113 11/23/19 0257 11/24/19 0252 11/25/19 0309  NA 136 140 144 140 146*  K 3.7 4.4 5.1 4.2 4.5  CL 99 102 105 105 106  CO2 24 25 26 24 26   GLUCOSE 139* 127* 130* 142* 153*  BUN 11 12 15  24* 29*  CREATININE 1.24 1.04 0.95 0.98 1.04  CALCIUM 7.9* 8.3* 8.7* 8.3* 8.7*     GFR: Estimated Creatinine Clearance: 104.8 mL/min (by C-G formula based on SCr of 1.04 mg/dL). Liver Function Tests: Recent Labs  Lab December 10, 2019 2100 11/22/19 1113 11/23/19 0257 11/24/19 0252 11/25/19 0309  AST 77* 64* 64* 46* 47*  ALT 39 38 38 36 46*  ALKPHOS 76 73 72 78 82  BILITOT 0.4 0.7 0.7 0.7 0.8  PROT 7.2 7.1 7.2 7.0 7.4  ALBUMIN 3.1* 3.0* 3.0* 3.0* 3.2*   No results for input(s): LIPASE, AMYLASE in the last 168 hours. No results for input(s): AMMONIA in the last 168 hours. Coagulation Profile: No results for input(s): INR, PROTIME in the last 168 hours. Cardiac Enzymes: No results for input(s): CKTOTAL, CKMB, CKMBINDEX, TROPONINI  in the last 168 hours. BNP (last 3 results) No results for input(s): PROBNP in the last 8760 hours. HbA1C: No results for input(s): HGBA1C in the last 72 hours. CBG: Recent Labs  Lab 11/24/19 1202 11/24/19 1701 11/24/19 2054 11/25/19 0730 11/25/19 1143  GLUCAP 145* 161* 151* 133* 154*   Lipid Profile: No results for input(s): CHOL, HDL, LDLCALC, TRIG, CHOLHDL, LDLDIRECT in the last 72 hours. Thyroid Function Tests: No results for input(s): TSH, T4TOTAL, FREET4, T3FREE, THYROIDAB in the last 72 hours. Anemia Panel: Recent Labs    11/24/19 0252 11/25/19 0309  FERRITIN 715* 546*   Urine analysis: No results found for: COLORURINE, APPEARANCEUR, LABSPEC, PHURINE, GLUCOSEU, HGBUR, BILIRUBINUR, KETONESUR, PROTEINUR, UROBILINOGEN, NITRITE, LEUKOCYTESUR Sepsis Labs: Invalid input(s): PROCALCITONIN, LACTICIDVEN  Recent Results (from the past 240 hour(s))  Respiratory Panel by RT PCR (Flu A&B, Covid) - Nasopharyngeal Swab     Status: Abnormal   Collection Time: December 10, 2019  9:00 PM   Specimen: Nasopharyngeal Swab  Result Value Ref Range Status   SARS Coronavirus 2 by RT PCR POSITIVE (A) NEGATIVE Final    Comment: RESULT CALLED TO, READ BACK BY AND VERIFIED WITH: MAYNARD,C AT 2154 ON 12/07/2019 BY CHERESNOWSKY,T (NOTE) SARS-CoV-2 target  nucleic acids are DETECTED.  SARS-CoV-2 RNA is generally detectable in upper respiratory specimens  during the acute phase of infection. Positive results are indicative of the presence of the identified virus, but do not rule out bacterial infection or co-infection with other pathogens not detected by the test. Clinical correlation with patient history and other diagnostic information is necessary to determine patient infection status. The expected result is Negative.  Fact Sheet for Patients:  https://www.moore.com/  Fact Sheet for Healthcare Providers: https://www.young.biz/  This test is not yet approved or cleared by the [place] FDA and  has been authorized for detection and/or diagnosis of SARS-CoV-2 by FDA under an Emergency Use Authorization (EUA).  This EUA will remain in effect (meaning this t est can be used) for the duration of  the COVID-19 declaration under Section 564(b)(1) of the Act, 21 U.S.C. section 360bbb-3(b)(1), unless the authorization is terminated or revoked sooner.      Influenza A by PCR NEGATIVE NEGATIVE Final   Influenza B by PCR NEGATIVE NEGATIVE Final    Comment: (NOTE) The Xpert Xpress SARS-CoV-2/FLU/RSV assay is intended as an aid in  the diagnosis of influenza from Nasopharyngeal swab specimens and  should not be used as a sole basis for treatment. Nasal washings and  aspirates are unacceptable for Xpert Xpress SARS-CoV-2/FLU/RSV  testing.  Fact Sheet for Patients: https://www.moore.com/  Fact Sheet for Healthcare Providers: https://www.young.biz/  This test is not yet approved or cleared by the [place] FDA and  has been authorized for detection and/or diagnosis of SARS-CoV-2 by  FDA under an Emergency Use Authorization (EUA). This EUA will remain  in effect (meaning this test can be used) for the duration of the  Covid-19 declaration under Section  564(b)(1) of the Act, 21  U.S.C. section 360bbb-3(b)(1), unless the authorization is  terminated or revoked. Performed at [facility], [address] [place], [place] [address]   Blood Culture (routine x 2)     Status: None (Preliminary result)   Collection Time: 2019/12/10  9:00 PM   Specimen: BLOOD  Result Value Ref Range Status   Specimen Description   Final    BLOOD RIGHT ANTECUBITAL Performed at [facility], [address] [place], [place] [address]  Special Requests   Final    BOTTLES DRAWN AEROBIC AND ANAEROBIC Blood Culture adequate volume Performed at [facility], [address] [place], [place] [address]    Culture   Final    NO GROWTH 2 DAYS Performed at [facility], [address] [place], [place] [address]    Report Status PENDING  Incomplete  Blood Culture (routine x 2)     Status: None (Preliminary result)   Collection Time: 11/22/2019  9:05 PM   Specimen: BLOOD  Result Value Ref Range Status   Specimen Description   Final    BLOOD LEFT ANTECUBITAL Performed at [facility], [address] [place], [place] [address]    Special Requests   Final    BOTTLES DRAWN AEROBIC AND ANAEROBIC Blood Culture adequate volume Performed at [facility], [address] [place], [place] [address]    Culture   Final    NO GROWTH 2 DAYS Performed at [facility], [address] [place], [place] [address]    Report Status PENDING  Incomplete  MRSA PCR Screening     Status: None   Collection Time: 11/22/19 10:11 AM   Specimen: Nasal Mucosa; Nasopharyngeal  Result Value Ref Range Status   MRSA by PCR NEGATIVE NEGATIVE Final    Comment:        The GeneXpert MRSA Assay (FDA approved for NASAL specimens only), is one component of a comprehensive MRSA colonization surveillance program. It is not intended to diagnose MRSA infection nor to guide or monitor treatment for MRSA infections. Performed at  [facility], [address], [place], [place] [address]       Radiology Studies: DG CHEST PORT 1 VIEW  Result Date: 11/25/2019 CLINICAL DATA:  Respiratory failure.  COVID infection. EXAM: PORTABLE CHEST 1 VIEW COMPARISON:  11/23/2019 FINDINGS: 0433 hours. Low volumes with mild asymmetric elevation of the right hemidiaphragm. The cardio pericardial silhouette is enlarged. Diffuse bilateral airspace disease again noted without substantial interval change. The visualized bony structures of the thorax show no acute abnormality. Telemetry leads overlie the chest. IMPRESSION: No substantial interval change in bilateral patchy airspace disease. Electronically Signed   By: Kennith Center M.D.   On: 11/25/2019 07:37   ECHOCARDIOGRAM COMPLETE  Result Date: 11/23/2019    ECHOCARDIOGRAM REPORT   Patient Name:   Stuart Singh Date of Exam: 11/23/2019 Medical Rec #:  [id number]     Height:       68.0 in Accession #:    [id number]    Weight:       260.1 lb Date of Birth:  1971-01-10     BSA:          2.285 m Patient Age:    49 years      BP:           119/59 mmHg Patient Gender: M             HR:           80 bpm. Exam Location:  Inpatient Procedure: 2D Echo Indications:    Cardiomegaly 429.3 / I51.7  History:        Patient has no prior history of Echocardiogram examinations.                 COVID 19.  Sonographer:    Leta Jungling RDCS Referring Phys: [id number] [facility] M PATEL IMPRESSIONS  1. Left ventricular ejection  fraction, by estimation, is 60 to 65%. The left ventricle has normal function. The left ventricle has no regional wall motion abnormalities. Left ventricular diastolic parameters were normal.  2. Right ventricular systolic function is normal. The right ventricular size is normal.  3. The mitral valve is normal in structure. No evidence of mitral valve regurgitation. No evidence of mitral stenosis.  4. The aortic valve is normal in structure. Aortic valve regurgitation is not  visualized. No aortic stenosis is present.  5. The inferior vena cava is normal in size with greater than 50% respiratory variability, suggesting right atrial pressure of 3 mmHg. FINDINGS  Left Ventricle: Left ventricular ejection fraction, by estimation, is 60 to 65%. The left ventricle has normal function. The left ventricle has no regional wall motion abnormalities. The left ventricular internal cavity size was normal in size. There is  no left ventricular hypertrophy. Left ventricular diastolic parameters were normal. Normal left ventricular filling pressure. Right Ventricle: The right ventricular size is normal. No increase in right ventricular wall thickness. Right ventricular systolic function is normal. Left Atrium: Left atrial size was normal in size. Right Atrium: Right atrial size was normal in size. Pericardium: There is no evidence of pericardial effusion. Mitral Valve: The mitral valve is normal in structure. No evidence of mitral valve regurgitation. No evidence of mitral valve stenosis. Tricuspid Valve: The tricuspid valve is normal in structure. Tricuspid valve regurgitation is not demonstrated. No evidence of tricuspid stenosis. Aortic Valve: The aortic valve is normal in structure. Aortic valve regurgitation is not visualized. No aortic stenosis is present. Pulmonic Valve: The pulmonic valve was normal in structure. Pulmonic valve regurgitation is not visualized. No evidence of pulmonic stenosis. Aorta: The aortic root is normal in size and structure. Venous: The inferior vena cava is normal in size with greater than 50% respiratory variability, suggesting right atrial pressure of 3 mmHg. IAS/Shunts: No atrial level shunt detected by color flow Doppler.  LEFT VENTRICLE PLAX 2D LVIDd:         4.27 cm  Diastology LVIDs:         2.74 cm  LV e' medial:    6.96 cm/s LV PW:         0.99 cm  LV E/e' medial:  8.9 LV IVS:        1.14 cm  LV e' lateral:   8.16 cm/s LVOT diam:     2.00 cm  LV E/e' lateral:  7.6 LV SV:         52 LV SV Index:   23 LVOT Area:     3.14 cm  LEFT ATRIUM             Index LA diam:        2.30 cm 1.01 cm/m LA Vol (A2C):   41.4 ml 18.09 ml/m LA Vol (A4C):   22.8 ml 9.98 ml/m LA Biplane Vol: 29.2 ml 12.78 ml/m  AORTIC VALVE LVOT Vmax:   99.20 cm/s LVOT Vmean:  65.800 cm/s LVOT VTI:    0.164 m  AORTA Ao Root diam: 3.70 cm MITRAL VALVE MV Area (PHT): 4.68 cm    SHUNTS MV Decel Time: 162 msec    Systemic VTI:  0.16 m MV E velocity: 61.80 cm/s  Systemic Diam: 2.00 cm MV A velocity: 47.70 cm/s MV E/A ratio:  1.30 Mihai Croitoru MD Electronically signed by Thurmon FairMihai Croitoru MD Signature Date/Time: 11/23/2019/1:40:14 PM    Final      Pamella Pertostin Nhu Glasby, MD, PhD [facility]  Between 7 am - 7 pm I am available, please contact me via Amion or Securechat  Between 7 pm - 7 am I am not available, please contact night coverage MD/APP via Amion

## 2019-11-25 NOTE — Progress Notes (Signed)
   NAME:  Stuart Singh, MRN:  [id number], DOB:  [birth year], LOS: 3 ADMISSION DATE:  12-06-19, CONSULTATION DATE:  10/11 REFERRING MD:  Dr. Ronaldo Miyamoto CHIEF COMPLAINT:  SOB, COVID PNA   Brief History   49 y/o M admitted with acute hypoxic respiratory failure with bilateral infiltrates in the setting of COVID PNA. Required heated HFNC, intermittent BiPAP.    Past Medical History    Significant Hospital Events   10/10 Admit with COVID PNA   10/12 On bipap 10/14 60L flow, 100% FiO2  Consults:  COVID   Procedures:     Significant Diagnostic Tests:  10/12 ECHO >> LVEF 60-65%, no RWMA, RV systolic function normal  Micro Data:  COVID 10/10 >> positive  Influenza A/B 10/10 >> negative BCx2 10/10 >>  MRSA PCR 10/10 >> negative   Antimicrobials/COVID RX  Tociluzimab X 2- 10/11, 10/12 >> Remdesivir 10/12 >> 10/14 Solumedrol 10/12 >>  Interim history/subjective:  On 60L / 100% FiO2 Wore BiPAP overnight  Pt reports he feels better today  Afebrile  I/O 1.6L UOP, -1.5L in last 24 hours   Objective   Blood pressure 106/64, pulse 83, temperature 97.7 F (36.5 C), temperature source Axillary, resp. rate (!) 32, height 5\' 8"  (1.727 m), weight 118 kg, SpO2 95 %.    Vent Mode: BIPAP;PSV FiO2 (%):  [90 %-100 %] 100 % Set Rate:  [12 bmp] 12 bmp PEEP:  [8 cmH20] 8 cmH20 Pressure Support:  [10 cmH20] 10 cmH20   Intake/Output Summary (Last 24 hours) at 11/25/2019 0901 Last data filed at 11/25/2019 0600 Gross per 24 hour  Intake 99.1 ml  Output 1650 ml  Net -1550.9 ml   Filed Weights   12-06-2019 2148 11/22/19 0946  Weight: 117.9 kg 118 kg    Examination: General: adult male sitting up in bed watching [employer]   HEENT: MM pink/moist, [place] O2, eating breakfast  Neuro: AAOx4, speech clear, MAE CV: s1s2 RRR, no m/r/g PULM: non-labored on heated high flow, lungs bilaterally clear  GI: soft, bsx4 active  Extremities: warm/dry, no edema  Skin: no rashes or lesions  Resolved Hospital  Problem list      Assessment & Plan:   Acute Hypoxic Respiratory Failure in setting of Diffuse Bilateral Infiltrates secondary to COVID PNA Started on decadron, remdesivir 10/10,  Actemra 10/11, 10/12.  Reportedly vaccinated.  -continue heated high flow for sats >85% -encourage prone positioning if able  -continue solumedrol 120 mg BID, taper as O2 needs improve -remdesivir to complete 10/14 -follow intermittent CXR  -pulmonary hygiene - IS, mobilize  -PRN tussionex for cough  -lasix 20 mg BID   Elevated D-Dimer, Rule Out PE D-dimer increased to > 20.  LE duplex and ECHO negative.  -CTA ordered if able to transfer off floor  -empiric lovenox 120 mg BID    Best practice:  Diet: Regular, as tolerated  Pain/Anxiety/Delirium protocol (if indicated): n/a  VAP protocol (if indicated): n/a  DVT prophylaxis: Lovenox  GI prophylaxis: n/a Glucose control: per primary  Mobility: as tolerated Code Status: Full Code per patient Family Communication: Per primary Disposition: per primary   Critical care time:    11/14, MSN, NP-C, AGACNP-BC [facility] 11/25/2019, 9:01 AM   Please see [email or web address] for pager details.

## 2019-11-26 ENCOUNTER — Inpatient Hospital Stay (HOSPITAL_COMMUNITY): Payer: BC Managed Care – PPO

## 2019-11-26 DIAGNOSIS — R0602 Shortness of breath: Secondary | ICD-10-CM | POA: Diagnosis not present

## 2019-11-26 DIAGNOSIS — J9600 Acute respiratory failure, unspecified whether with hypoxia or hypercapnia: Secondary | ICD-10-CM | POA: Diagnosis not present

## 2019-11-26 DIAGNOSIS — U071 COVID-19: Secondary | ICD-10-CM | POA: Diagnosis not present

## 2019-11-26 DIAGNOSIS — J1282 Pneumonia due to Coronavirus disease 2019: Secondary | ICD-10-CM | POA: Diagnosis not present

## 2019-11-26 LAB — CBC WITH DIFFERENTIAL/PLATELET
Abs Immature Granulocytes: 0.43 10*3/uL — ABNORMAL HIGH (ref 0.00–0.07)
Basophils Absolute: 0.1 10*3/uL (ref 0.0–0.1)
Basophils Relative: 0 %
Eosinophils Absolute: 0 10*3/uL (ref 0.0–0.5)
Eosinophils Relative: 0 %
HCT: 43.6 % (ref 39.0–52.0)
Hemoglobin: 14.7 g/dL (ref 13.0–17.0)
Immature Granulocytes: 3 %
Lymphocytes Relative: 6 %
Lymphs Abs: 0.9 10*3/uL (ref 0.7–4.0)
MCH: 30.8 pg (ref 26.0–34.0)
MCHC: 33.7 g/dL (ref 30.0–36.0)
MCV: 91.2 fL (ref 80.0–100.0)
Monocytes Absolute: 0.3 10*3/uL (ref 0.1–1.0)
Monocytes Relative: 2 %
Neutro Abs: 14.1 10*3/uL — ABNORMAL HIGH (ref 1.7–7.7)
Neutrophils Relative %: 89 %
Platelets: 232 10*3/uL (ref 150–400)
RBC: 4.78 MIL/uL (ref 4.22–5.81)
RDW: 13 % (ref 11.5–15.5)
WBC: 15.8 10*3/uL — ABNORMAL HIGH (ref 4.0–10.5)
nRBC: 0 % (ref 0.0–0.2)

## 2019-11-26 LAB — COMPREHENSIVE METABOLIC PANEL
ALT: 44 U/L (ref 0–44)
AST: 43 U/L — ABNORMAL HIGH (ref 15–41)
Albumin: 3.3 g/dL — ABNORMAL LOW (ref 3.5–5.0)
Alkaline Phosphatase: 92 U/L (ref 38–126)
Anion gap: 13 (ref 5–15)
BUN: 24 mg/dL — ABNORMAL HIGH (ref 6–20)
CO2: 24 mmol/L (ref 22–32)
Calcium: 8.4 mg/dL — ABNORMAL LOW (ref 8.9–10.3)
Chloride: 102 mmol/L (ref 98–111)
Creatinine, Ser: 1.1 mg/dL (ref 0.61–1.24)
GFR, Estimated: >60 mL/min (ref >60)
Glucose, Bld: 151 mg/dL — ABNORMAL HIGH (ref 70–99)
Potassium: 4.4 mmol/L (ref 3.5–5.1)
Sodium: 139 mmol/L (ref 135–145)
Total Bilirubin: 0.7 mg/dL (ref 0.3–1.2)
Total Protein: 6.9 g/dL (ref 6.5–8.1)

## 2019-11-26 LAB — GLUCOSE, CAPILLARY
Glucose-Capillary: 134 mg/dL — ABNORMAL HIGH (ref 70–99)
Glucose-Capillary: 154 mg/dL — ABNORMAL HIGH (ref 70–99)
Glucose-Capillary: 157 mg/dL — ABNORMAL HIGH (ref 70–99)
Glucose-Capillary: 142 mg/dL — ABNORMAL HIGH (ref 70–99)

## 2019-11-26 LAB — D-DIMER, QUANTITATIVE: D-Dimer, Quant: 13.86 ug/mL-FEU — ABNORMAL HIGH (ref 0.00–0.50)

## 2019-11-26 LAB — FERRITIN: Ferritin: 548 ng/mL — ABNORMAL HIGH (ref 24–336)

## 2019-11-26 LAB — C-REACTIVE PROTEIN: CRP: 0.9 mg/dL (ref <1.0)

## 2019-11-26 NOTE — Progress Notes (Signed)
   NAME:  Stuart Singh, MRN:  [id number], DOB:  Jun 05, 1970, LOS: 4 ADMISSION DATE:  12/03/2019, CONSULTATION DATE:  10/11 REFERRING MD:  Dr. Ronaldo Miyamoto CHIEF COMPLAINT:  SOB, COVID PNA   Brief History   49 y/o M admitted with acute hypoxic respiratory failure with bilateral infiltrates in the setting of COVID PNA. Required heated HFNC, intermittent BiPAP.    Past Medical History    Significant Hospital Events   10/10 Admit with COVID PNA   10/12 On bipap 10/14 60L flow, 100% FiO2  Consults:  COVID   Procedures:     Significant Diagnostic Tests:  10/12 ECHO >> LVEF 60-65%, no RWMA, RV systolic function normal  Micro Data:  COVID 10/10 >> positive  Influenza A/B 10/10 >> negative BCx2 10/10 >>  MRSA PCR 10/10 >> negative   Antimicrobials/COVID RX  Tociluzimab X 2- 10/11, 10/12 >> Remdesivir 10/12 >> 10/14 Solumedrol 10/12 >>  Interim history/subjective:   No distress  Objective   Blood pressure (Abnormal) 136/96, pulse 84, temperature 99 F (37.2 C), temperature source Axillary, resp. rate (Abnormal) 24, height 5\' 8"  (1.727 m), weight 118 kg, SpO2 (Abnormal) 88 %.    FiO2 (%):  [90 %-100 %] 90 %   Intake/Output Summary (Last 24 hours) at 11/26/2019 0835 Last data filed at 11/26/2019 11/28/2019 Gross per 24 hour  Intake 96.57 ml  Output 2450 ml  Net -2353.43 ml   Filed Weights   11/25/2019 2148 11/22/19 0946  Weight: 117.9 kg 118 kg    Examination:  General this is a 49 year old male resting in bed. Still desaturates rapidly down to 60s when NRB removed HENT NCAT no JVD pulm RR 30s. Currently on heated hight flow and NRB. Dec t/o.  Card rrr abd soft Ext warm and dry  Neuro intact   Resolved Hospital Problem list      Assessment & Plan:   Acute Hypoxic Respiratory Failure in setting of Diffuse Bilateral Infiltrates secondary to COVID PNA Completed remdesivir, recieved Actemra 10/11, 10/12.   PCXR reviewed: much worse R>L airspace disease.  Plan Cont to  titrate heated high flow and supplemental NRB w/ goal sats > 85% Encourage self proning Day day 4/5 high dose solumedrol (120 bid); initiate slow taper on 10/16 Intermittent cxr IS Pulse ox Lasix PRN    Elevated D-Dimer, Rule Out PE D-dimer increased to > 20.  LE duplex and ECHO negative.  Plan Cont therapeutic LMWH    Best practice:  Diet: Regular, as tolerated  Pain/Anxiety/Delirium protocol (if indicated): n/a  VAP protocol (if indicated): n/a  DVT prophylaxis: Lovenox  GI prophylaxis: n/a Glucose control: per primary  Mobility: as tolerated Code Status: Full Code per patient Family Communication: Per primary Disposition: per primary    My cct 32 min  11/16 ACNP-BC [facility] Pulmonary/Critical Care Pager # [phone] OR # [phone] if no answer

## 2019-11-26 NOTE — Progress Notes (Signed)
Sleepy, arises easily

## 2019-11-26 NOTE — Progress Notes (Signed)
PROGRESS NOTE  Cerrone Debold NID:[id number] DOB: 06/14/1970 DOA: 11/27/2019 PCP: Patient, No Pcp Per   LOS: 4 days   Brief Narrative / Interim history: 49 year old male with history of obesity, reported vaccination for Covid on admission, admitted to the hospital with respiratory failure, chest x-ray showed bilateral infiltrates and he was positive for Covid 19.  He apparently attended the funeral 2 weeks prior to admit and has been exposed there.  He was short of breath for couple of days but suddenly gotten worse the day of admission.  He was 53% on room air requiring supplemental oxygen.  He has been admitted to stepdown, critical care was consulted  Subjective / 24h Interval events: Prone in bed, sats in the upper 80s.  Denies shortness of breath  Assessment & Plan:  Principal Problem Acute Hypoxic Respiratory Failure due to Covid-19 Viral Illness, sepsis ruled in -remains severely hypoxemic, prone, on BiPAP, 90% FiO2  FiO2 (%):  [90 %-100 %] 90 %   -Continue to monitor inflammatory markers, D-dimer jumped to greater than 20 on 10/13, patient too unstable to get a CT angiogram but highly suspect PE.  Started empiric anticoagulation, discussed with critical care. -Started on remdesivir on 10/10, continue steroids, received Actemra x2 on 10/11 and 10/12. -Continue supportive treatment -OK with mild hypoxemia, goal at rest is > 85% SaO2, with movement ideally > 75%  COVID-19 Labs  Recent Labs    11/24/19 0252 11/25/19 0309 11/26/19 0247  DDIMER >20.00* >20.00* 13.86*  FERRITIN 715* 546* 548*  CRP 4.7* 2.2* 0.9    Lab Results  Component Value Date   SARSCOV2NAA POSITIVE (A) 12/02/2019   Active Problems  CBG (last 3)  Recent Labs    11/25/19 2145 11/26/19 0750 11/26/19 1119  GLUCAP 145* 134* 154*   Elevated LFTs -Likely in the setting of Covid infection, overall stable, normalizing  Obesity, BMI 39 -Patient will benefit from weight loss   Scheduled Meds: .  albuterol  6 puff Inhalation Q6H  . vitamin C  500 mg Oral Daily  . chlorhexidine  15 mL Mouth Rinse BID  . Chlorhexidine Gluconate Cloth  6 each Topical Daily  . docusate sodium  100 mg Oral Daily  . enoxaparin (LOVENOX) injection  120 mg Subcutaneous Q12H  . furosemide  20 mg Intravenous BID  . insulin aspart  0-15 Units Subcutaneous TID WC  . insulin aspart  0-5 Units Subcutaneous QHS  . insulin aspart  3 Units Subcutaneous TID WC  . linagliptin  5 mg Oral Daily  . mouth rinse  15 mL Mouth Rinse q12n4p  . methylPREDNISolone (SOLU-MEDROL) injection  120 mg Intravenous Q12H  . nystatin  5 mL Oral QID  . polyethylene glycol  17 g Oral Daily  . senna  1 tablet Oral Daily  . zinc sulfate  220 mg Oral Daily   Continuous Infusions: . sodium chloride Stopped (11/23/19 0855)   PRN Meds:.sodium chloride, chlorpheniramine-HYDROcodone, guaiFENesin-dextromethorphan, morphine injection  DVT prophylaxis: Lovenox Code Status: Full code Family Communication: no family at bedside, updated Sister Zadkiel Dragan [phone] over the phone  Status is: Inpatient  Remains inpatient appropriate because:Inpatient level of care appropriate due to severity of illness   Dispo: The patient is from: Home              Anticipated d/c is to: Home              Anticipated d/c date is: > 3 days  Patient currently is not medically stable to d/c.  Consultants:  PCCM  Procedures:  2D echo:  1. Left ventricular ejection fraction, by estimation, is 60 to 65%. The left ventricle has normal function. The left ventricle has no regional wall motion abnormalities. Left ventricular diastolic parameters were normal.  2. Right ventricular systolic function is normal. The right ventricular size is normal.  3. The mitral valve is normal in structure. No evidence of mitral valve regurgitation. No evidence of mitral stenosis.  4. The aortic valve is normal in structure. Aortic valve regurgitation is not  visualized. No aortic stenosis is present.  5. The inferior vena cava is normal in size with greater than 50% respiratory variability, suggesting right atrial pressure of 3 mmHg.   Microbiology: None   Antibacterials: None    Objective: Vitals:   11/26/19 0753 11/26/19 0808 11/26/19 0816 11/26/19 1000  BP:  136/73  135/83  Pulse:  83  (!) 119  Resp:  (!) 30  (!) 48  Temp: 99 F (37.2 C)     TempSrc: Axillary     SpO2:  (!) 86% (!) 88% 90%  Weight:      Height:        Intake/Output Summary (Last 24 hours) at 11/26/2019 1133 Last data filed at 11/26/2019 1038 Gross per 24 hour  Intake 237 ml  Output 2450 ml  Net -2213 ml   Filed Weights   12/08/2019 2148 11/22/19 0946  Weight: 117.9 kg 118 kg    Examination:  Constitutional: Prone, BiPAP on, appears comfortable overall Eyes: No scleral icterus Neck: normal, supple Respiratory: Coarse breath sounds, breathing with a BiPAP, no wheezing Cardiovascular: Heart is regular, no murmurs or peripheral edema appreciated Abdomen: Soft, nontender, nondistended, bowel sounds positive Musculoskeletal: no clubbing / cyanosis.  Skin: No rashes seen Neurologic: Nonfocal, equal strength  Data Reviewed: I have independently reviewed following labs and imaging studies   CBC: Recent Labs  Lab 11/22/19 1113 11/23/19 0257 11/24/19 0252 11/25/19 0309 11/26/19 0247  WBC 4.0 3.4* 4.8 10.0 15.8*  NEUTROABS 3.3 2.3 4.0 8.7* 14.1*  HGB 13.3 13.4 14.2 15.1 14.7  HCT 40.3 40.8 42.7 44.6 43.6  MCV 92.9 93.8 91.6 91.6 91.2  PLT 170 202 179 220 232   Basic Metabolic Panel: Recent Labs  Lab 11/22/19 1113 11/23/19 0257 11/24/19 0252 11/25/19 0309 11/26/19 0247  NA 140 144 140 146* 139  K 4.4 5.1 4.2 4.5 4.4  CL 102 105 105 106 102  CO2 25 26 24 26 24   GLUCOSE 127* 130* 142* 153* 151*  BUN 12 15 24* 29* 24*  CREATININE 1.04 0.95 0.98 1.04 1.10  CALCIUM 8.3* 8.7* 8.3* 8.7* 8.4*   GFR: Estimated Creatinine Clearance: 99.1  mL/min (by C-G formula based on SCr of 1.1 mg/dL). Liver Function Tests: Recent Labs  Lab 11/22/19 1113 11/23/19 0257 11/24/19 0252 11/25/19 0309 11/26/19 0247  AST 64* 64* 46* 47* 43*  ALT 38 38 36 46* 44  ALKPHOS 73 72 78 82 92  BILITOT 0.7 0.7 0.7 0.8 0.7  PROT 7.1 7.2 7.0 7.4 6.9  ALBUMIN 3.0* 3.0* 3.0* 3.2* 3.3*   No results for input(s): LIPASE, AMYLASE in the last 168 hours. No results for input(s): AMMONIA in the last 168 hours. Coagulation Profile: No results for input(s): INR, PROTIME in the last 168 hours. Cardiac Enzymes: No results for input(s): CKTOTAL, CKMB, CKMBINDEX, TROPONINI in the last 168 hours. BNP (last 3 results) No results for input(s): PROBNP in  the last 8760 hours. HbA1C: No results for input(s): HGBA1C in the last 72 hours. CBG: Recent Labs  Lab 11/25/19 1143 11/25/19 1551 11/25/19 2145 11/26/19 0750 11/26/19 1119  GLUCAP 154* 180* 145* 134* 154*   Lipid Profile: No results for input(s): CHOL, HDL, LDLCALC, TRIG, CHOLHDL, LDLDIRECT in the last 72 hours. Thyroid Function Tests: No results for input(s): TSH, T4TOTAL, FREET4, T3FREE, THYROIDAB in the last 72 hours. Anemia Panel: Recent Labs    11/25/19 0309 11/26/19 0247  FERRITIN 546* 548*   Urine analysis: No results found for: COLORURINE, APPEARANCEUR, LABSPEC, PHURINE, GLUCOSEU, HGBUR, BILIRUBINUR, KETONESUR, PROTEINUR, UROBILINOGEN, NITRITE, LEUKOCYTESUR Sepsis Labs: Invalid input(s): PROCALCITONIN, LACTICIDVEN  Recent Results (from the past 240 hour(s))  Respiratory Panel by RT PCR (Flu A&B, Covid) - Nasopharyngeal Swab     Status: Abnormal   Collection Time: 10/27/19  9:00 PM   Specimen: Nasopharyngeal Swab  Result Value Ref Range Status   SARS Coronavirus 2 by RT PCR POSITIVE (A) NEGATIVE Final    Comment: RESULT CALLED TO, READ BACK BY AND VERIFIED WITH: MAYNARD,C AT 2154 ON 11-17-19 BY CHERESNOWSKY,T (NOTE) SARS-CoV-2 target nucleic acids are DETECTED.  SARS-CoV-2 RNA is  generally detectable in upper respiratory specimens  during the acute phase of infection. Positive results are indicative of the presence of the identified virus, but do not rule out bacterial infection or co-infection with other pathogens not detected by the test. Clinical correlation with patient history and other diagnostic information is necessary to determine patient infection status. The expected result is Negative.  Fact Sheet for Patients:  https://www.moore.com/https://www.fda.gov/media/142436/download  Fact Sheet for Healthcare Providers: https://www.young.biz/https://www.fda.gov/media/142435/download  This test is not yet approved or cleared by the [place] FDA and  has been authorized for detection and/or diagnosis of SARS-CoV-2 by FDA under an Emergency Use Authorization (EUA).  This EUA will remain in effect (meaning this t est can be used) for the duration of  the COVID-19 declaration under Section 564(b)(1) of the Act, 21 U.S.C. section 360bbb-3(b)(1), unless the authorization is terminated or revoked sooner.      Influenza A by PCR NEGATIVE NEGATIVE Final   Influenza B by PCR NEGATIVE NEGATIVE Final    Comment: (NOTE) The Xpert Xpress SARS-CoV-2/FLU/RSV assay is intended as an aid in  the diagnosis of influenza from Nasopharyngeal swab specimens and  should not be used as a sole basis for treatment. Nasal washings and  aspirates are unacceptable for Xpert Xpress SARS-CoV-2/FLU/RSV  testing.  Fact Sheet for Patients: https://www.moore.com/https://www.fda.gov/media/142436/download  Fact Sheet for Healthcare Providers: https://www.young.biz/https://www.fda.gov/media/142435/download  This test is not yet approved or cleared by the [place] FDA and  has been authorized for detection and/or diagnosis of SARS-CoV-2 by  FDA under an Emergency Use Authorization (EUA). This EUA will remain  in effect (meaning this test can be used) for the duration of the  Covid-19 declaration under Section 564(b)(1) of the Act, 21  U.S.C. section  360bbb-3(b)(1), unless the authorization is  terminated or revoked. Performed at [facility] [place], [address] [place], [place] [address]   Blood Culture (routine x 2)     Status: None (Preliminary result)   Collection Time: 10/27/19  9:00 PM   Specimen: BLOOD  Result Value Ref Range Status   Specimen Description   Final    BLOOD RIGHT ANTECUBITAL Performed at [facility] [place], [address] [place], [place] [address]    Special Requests   Final    BOTTLES DRAWN AEROBIC AND  ANAEROBIC Blood Culture adequate volume Performed at [facility], [address] [place], [place] [address]    Culture   Final    NO GROWTH 4 DAYS Performed at [facility], [address] [place], [place] [address]    Report Status PENDING  Incomplete  Blood Culture (routine x 2)     Status: None (Preliminary result)   Collection Time: Dec 04, 2019  9:05 PM   Specimen: BLOOD  Result Value Ref Range Status   Specimen Description   Final    BLOOD LEFT ANTECUBITAL Performed at [facility], [address] [place], [place] [address]    Special Requests   Final    BOTTLES DRAWN AEROBIC AND ANAEROBIC Blood Culture adequate volume Performed at [facility], [address] [place], [place] [address]    Culture   Final    NO GROWTH 4 DAYS Performed at [facility], [address] [place], [place] [address]    Report Status PENDING  Incomplete  MRSA PCR Screening     Status: None   Collection Time: 11/22/19 10:11 AM   Specimen: Nasal Mucosa; Nasopharyngeal  Result Value Ref Range Status   MRSA by PCR NEGATIVE NEGATIVE Final    Comment:        The GeneXpert MRSA Assay (FDA approved for NASAL specimens only), is one component of a comprehensive MRSA colonization surveillance program. It is not intended to diagnose MRSA infection nor to guide or monitor treatment for MRSA infections. Performed at [facility], [address], [place], [place] [address]       Radiology Studies: DG CHEST PORT 1 VIEW  Result Date: 11/26/2019 CLINICAL DATA:  Acute respiratory failure with hypoxia. Patient in prone position. EXAM: PORTABLE CHEST 1 VIEW COMPARISON:  One-view chest x-ray 11/25/2019 FINDINGS: Heart size is normal. Increased interstitial and airspace opacities noted, right greater than left. Right-sided effusion is present. IMPRESSION: 1. Increased interstitial and airspace disease, right greater than left. Findings are compatible with pneumonia. 2. Right-sided effusion. Electronically Signed   By: Marin Roberts M.D.   On: 11/26/2019 06:57   DG CHEST PORT 1 VIEW  Result Date: 11/25/2019 CLINICAL DATA:  Respiratory failure.  COVID infection. EXAM: PORTABLE CHEST 1 VIEW COMPARISON:  11/23/2019 FINDINGS: 0433 hours. Low volumes with mild asymmetric elevation of the right hemidiaphragm. The cardio pericardial silhouette is enlarged. Diffuse bilateral airspace disease again noted without substantial interval change. The visualized bony structures of the thorax show no acute abnormality. Telemetry leads overlie the chest. IMPRESSION: No substantial interval change in bilateral patchy airspace disease. Electronically Signed   By: Kennith Center M.D.   On: 11/25/2019 07:37     Pamella Pert, MD, PhD [facility]  Between 7 am - 7 pm I am available, please contact me via Amion or Securechat  Between 7 pm - 7 am I am not available, please contact night coverage MD/APP via Amion

## 2019-11-26 NOTE — Progress Notes (Signed)
Replaced water bottle on heated high flow 02 system - uneventful. 

## 2019-11-26 NOTE — Progress Notes (Signed)
ANTICOAGULATION CONSULT NOTE   Pharmacy Consult for lovenox Indication: r/o pulmonary embolus  Allergies  Allergen Reactions  . Codeine Rash  . Anacin-3 [Acetaminophen]     Tylenol 3    Patient Measurements: Height: 5\' 8"  (172.7 cm) Weight: 118 kg (260 lb 2.3 oz) IBW/kg (Calculated) : 68.4 Heparin Dosing Weight:   Vital Signs: Temp: 99 F (37.2 C) (10/15 0753) Temp Source: Axillary (10/15 0753) BP: 136/73 (10/15 0808) Pulse Rate: 83 (10/15 0808)  Labs: Recent Labs    11/24/19 0252 11/24/19 0252 11/25/19 0309 11/26/19 0247  HGB 14.2   < > 15.1 14.7  HCT 42.7  --  44.6 43.6  PLT 179  --  220 232  CREATININE 0.98  --  1.04 1.10   < > = values in this interval not displayed.    Estimated Creatinine Clearance: 99.1 mL/min (by C-G formula based on SCr of 1.1 mg/dL).   Assessment: Patient is a 49 y.o M presented to the ED on 10/10 with c/o SOB and subsequently tested positive for COVID-19.  He was started on LMWH prophylaxis dose on admission.  Ddimer increased to >20 on 10/13.  Pharmacy consulted to increase LMWH to full dose on 10/13 for r/o PE.  - 10/12 LE doppler: neg for DVT  Today, 11/26/2019: - cbc stable - no bleeding documented - Ddimer remains elevated but down to 13.86 today - unable to perform chest CTA at this time d/t pt's clinical status  Goal of Therapy:  Anti-Xa level 0.6-1 units/ml 4hrs after LMWH dose given Monitor platelets by anticoagulation protocol: Yes   Plan:  - continue lovenox dose to 120 mg SQ q12h - monitor for s/sx bleeding - f/u chest CTA results if/when able to transport patient down for procedure  Landrie Beale P 11/26/2019,10:19 AM

## 2019-11-27 DIAGNOSIS — U071 COVID-19: Secondary | ICD-10-CM | POA: Diagnosis not present

## 2019-11-27 DIAGNOSIS — R0602 Shortness of breath: Secondary | ICD-10-CM | POA: Diagnosis not present

## 2019-11-27 DIAGNOSIS — J9600 Acute respiratory failure, unspecified whether with hypoxia or hypercapnia: Secondary | ICD-10-CM | POA: Diagnosis not present

## 2019-11-27 DIAGNOSIS — J1282 Pneumonia due to Coronavirus disease 2019: Secondary | ICD-10-CM | POA: Diagnosis not present

## 2019-11-27 LAB — CULTURE, BLOOD (ROUTINE X 2)
Culture: NO GROWTH
Culture: NO GROWTH
Special Requests: ADEQUATE
Special Requests: ADEQUATE

## 2019-11-27 LAB — COMPREHENSIVE METABOLIC PANEL
ALT: 43 U/L (ref 0–44)
AST: 40 U/L (ref 15–41)
Albumin: 3.4 g/dL — ABNORMAL LOW (ref 3.5–5.0)
Alkaline Phosphatase: 105 U/L (ref 38–126)
Anion gap: 12 (ref 5–15)
BUN: 25 mg/dL — ABNORMAL HIGH (ref 6–20)
CO2: 27 mmol/L (ref 22–32)
Calcium: 8.6 mg/dL — ABNORMAL LOW (ref 8.9–10.3)
Chloride: 98 mmol/L (ref 98–111)
Creatinine, Ser: 1.13 mg/dL (ref 0.61–1.24)
GFR, Estimated: >60 mL/min (ref >60)
Glucose, Bld: 151 mg/dL — ABNORMAL HIGH (ref 70–99)
Potassium: 5 mmol/L (ref 3.5–5.1)
Sodium: 137 mmol/L (ref 135–145)
Total Bilirubin: 1.2 mg/dL (ref 0.3–1.2)
Total Protein: 7.1 g/dL (ref 6.5–8.1)

## 2019-11-27 LAB — CBC WITH DIFFERENTIAL/PLATELET
Abs Immature Granulocytes: 0.46 K/uL — ABNORMAL HIGH (ref 0.00–0.07)
Basophils Absolute: 0.1 K/uL (ref 0.0–0.1)
Basophils Relative: 0 %
Eosinophils Absolute: 0 K/uL (ref 0.0–0.5)
Eosinophils Relative: 0 %
HCT: 45.9 % (ref 39.0–52.0)
Hemoglobin: 15.3 g/dL (ref 13.0–17.0)
Immature Granulocytes: 3 %
Lymphocytes Relative: 5 %
Lymphs Abs: 0.9 K/uL (ref 0.7–4.0)
MCH: 30.5 pg (ref 26.0–34.0)
MCHC: 33.3 g/dL (ref 30.0–36.0)
MCV: 91.6 fL (ref 80.0–100.0)
Monocytes Absolute: 0.3 K/uL (ref 0.1–1.0)
Monocytes Relative: 2 %
Neutro Abs: 15.5 K/uL — ABNORMAL HIGH (ref 1.7–7.7)
Neutrophils Relative %: 90 %
Platelets: 259 K/uL (ref 150–400)
RBC: 5.01 MIL/uL (ref 4.22–5.81)
RDW: 13.1 % (ref 11.5–15.5)
WBC: 17.2 K/uL — ABNORMAL HIGH (ref 4.0–10.5)
nRBC: 0 % (ref 0.0–0.2)

## 2019-11-27 LAB — GLUCOSE, CAPILLARY
Glucose-Capillary: 141 mg/dL — ABNORMAL HIGH (ref 70–99)
Glucose-Capillary: 144 mg/dL — ABNORMAL HIGH (ref 70–99)
Glucose-Capillary: 136 mg/dL — ABNORMAL HIGH (ref 70–99)
Glucose-Capillary: 139 mg/dL — ABNORMAL HIGH (ref 70–99)

## 2019-11-27 LAB — FERRITIN: Ferritin: 503 ng/mL — ABNORMAL HIGH (ref 24–336)

## 2019-11-27 LAB — D-DIMER, QUANTITATIVE: D-Dimer, Quant: 8.73 ug/mL-FEU — ABNORMAL HIGH (ref 0.00–0.50)

## 2019-11-27 LAB — C-REACTIVE PROTEIN: CRP: 0.6 mg/dL

## 2019-11-27 MED ORDER — PHENOL 1.4 % MT LIQD: 1.0000 | OROMUCOSAL | Status: DC | PRN

## 2019-11-27 MED ORDER — AYR SALINE NASAL NA GEL
1.0000 "application " | NASAL | Status: DC | PRN
  Administered 2019-11-27 – 2019-11-30 (×3): 1 via NASAL
  Filled 2019-11-27 (×2): qty 14.1

## 2019-11-27 NOTE — Progress Notes (Signed)
PROGRESS NOTE  Stuart Singh HFW:[id number] DOB: 1970/05/28 DOA: 11/13/2019 PCP: Patient, No Pcp Per   LOS: 5 days   Brief Narrative / Interim history: 49 year old male with history of obesity, reported vaccination for Covid on admission, admitted to the hospital with respiratory failure, chest x-ray showed bilateral infiltrates and he was positive for Covid 19.  He apparently attended the funeral 2 weeks prior to admit and has been exposed there.  He was short of breath for couple of days but suddenly gotten worse the day of admission.  He was 53% on room air requiring supplemental oxygen.  He has been admitted to stepdown, critical care was consulted  Subjective / 24h Interval events: Using BiPAP this morning, feeling better  Assessment & Plan:  Principal Problem Acute Hypoxic Respiratory Failure due to Covid-19 Viral Illness, sepsis ruled in -Remains significantly hypoxemic, will give a trial off BiPAP and use heated high flow only  Vent Mode: BIPAP;PSV FiO2 (%):  [100 %] 100 % PEEP:  [8 cmH20] 8 cmH20 Pressure Support:  [10 cmH20] 10 cmH20   -Continue to monitor inflammatory markers, D-dimer jumped to greater than 20 on 10/13, patient too unstable to get a CT angiogram but highly suspect PE.  As clinically he is slightly better, will attempt to do the CT today.  Continue full dose anticoagulation -Started on remdesivir on 10/10, continue steroids, received Actemra x2 on 10/11 and 10/12. -Continue supportive treatment -OK with mild hypoxemia, goal at rest is > 85% SaO2, with movement ideally > 75%  COVID-19 Labs  Recent Labs    11/25/19 0309 11/26/19 0247 11/27/19 0320  DDIMER >20.00* 13.86* 8.73*  FERRITIN 546* 548* 503*  CRP 2.2* 0.9 0.6    Lab Results  Component Value Date   SARSCOV2NAA POSITIVE (A) 11/18/2019   Active Problems  CBG (last 3)  Recent Labs    11/26/19 2114 11/27/19 0923 11/27/19 1213  GLUCAP 142* 141* 144*   Elevated LFTs -Likely in the  setting of Covid infection, LFTs have now normalized  Obesity, BMI 39 -Patient will benefit from weight loss   Scheduled Meds: . albuterol  6 puff Inhalation Q6H  . vitamin C  500 mg Oral Daily  . chlorhexidine  15 mL Mouth Rinse BID  . Chlorhexidine Gluconate Cloth  6 each Topical Daily  . docusate sodium  100 mg Oral Daily  . enoxaparin (LOVENOX) injection  120 mg Subcutaneous Q12H  . furosemide  20 mg Intravenous BID  . insulin aspart  0-15 Units Subcutaneous TID WC  . insulin aspart  0-5 Units Subcutaneous QHS  . insulin aspart  3 Units Subcutaneous TID WC  . linagliptin  5 mg Oral Daily  . mouth rinse  15 mL Mouth Rinse q12n4p  . methylPREDNISolone (SOLU-MEDROL) injection  120 mg Intravenous Q12H  . nystatin  5 mL Oral QID  . polyethylene glycol  17 g Oral Daily  . senna  1 tablet Oral Daily  . zinc sulfate  220 mg Oral Daily   Continuous Infusions: . sodium chloride Stopped (11/23/19 0855)   PRN Meds:.sodium chloride, chlorpheniramine-HYDROcodone, guaiFENesin-dextromethorphan, morphine injection  DVT prophylaxis: Lovenox Code Status: Full code Family Communication: no family at bedside, updated Sister Amitai Delaughter [phone] over the phone on 10/15  Status is: Inpatient  Remains inpatient appropriate because:Inpatient level of care appropriate due to severity of illness   Dispo: The patient is from: Home              Anticipated d/c is  to: Home              Anticipated d/c date is: > 3 days              Patient currently is not medically stable to d/c.  Consultants:  PCCM  Procedures:  2D echo:  1. Left ventricular ejection fraction, by estimation, is 60 to 65%. The left ventricle has normal function. The left ventricle has no regional wall motion abnormalities. Left ventricular diastolic parameters were normal.  2. Right ventricular systolic function is normal. The right ventricular size is normal.  3. The mitral valve is normal in structure. No  evidence of mitral valve regurgitation. No evidence of mitral stenosis.  4. The aortic valve is normal in structure. Aortic valve regurgitation is not visualized. No aortic stenosis is present.  5. The inferior vena cava is normal in size with greater than 50% respiratory variability, suggesting right atrial pressure of 3 mmHg.   Microbiology: None   Antibacterials: None    Objective: Vitals:   11/27/19 0800 11/27/19 0919 11/27/19 1200 11/27/19 1359  BP: 114/60     Pulse: 71 86  88  Resp: 13 20  (!) 26  Temp: 97.7 F (36.5 C)  98.5 F (36.9 C)   TempSrc: Axillary  Axillary   SpO2: 96% 90%  (!) 88%  Weight:      Height:        Intake/Output Summary (Last 24 hours) at 11/27/2019 1434 Last data filed at 11/26/2019 2119 Gross per 24 hour  Intake --  Output 1900 ml  Net -1900 ml   Filed Weights   December 05, 2019 2148 11/22/19 0946  Weight: 117.9 kg 118 kg    Examination:  Constitutional: Appears comfortable Eyes: No scleral icterus Neck: normal, supple Respiratory: Coarse breath sounds, breathing with the BiPAP, no wheezing Cardiovascular: Heart is regular, no murmurs, no edema Abdomen: Soft, NT, ND, bowel sounds positive Musculoskeletal: no clubbing / cyanosis.  Skin: No rashes Neurologic: No focal deficits, equal strength  Data Reviewed: I have independently reviewed following labs and imaging studies   CBC: Recent Labs  Lab 11/23/19 0257 11/24/19 0252 11/25/19 0309 11/26/19 0247 11/27/19 0320  WBC 3.4* 4.8 10.0 15.8* 17.2*  NEUTROABS 2.3 4.0 8.7* 14.1* 15.5*  HGB 13.4 14.2 15.1 14.7 15.3  HCT 40.8 42.7 44.6 43.6 45.9  MCV 93.8 91.6 91.6 91.2 91.6  PLT 202 179 220 232 259   Basic Metabolic Panel: Recent Labs  Lab 11/23/19 0257 11/24/19 0252 11/25/19 0309 11/26/19 0247 11/27/19 0320  NA 144 140 146* 139 137  K 5.1 4.2 4.5 4.4 5.0  CL 105 105 106 102 98  CO2 26 24 26 24 27   GLUCOSE 130* 142* 153* 151* 151*  BUN 15 24* 29* 24* 25*  CREATININE 0.95  0.98 1.04 1.10 1.13  CALCIUM 8.7* 8.3* 8.7* 8.4* 8.6*   GFR: Estimated Creatinine Clearance: 96.5 mL/min (by C-G formula based on SCr of 1.13 mg/dL). Liver Function Tests: Recent Labs  Lab 11/23/19 0257 11/24/19 0252 11/25/19 0309 11/26/19 0247 11/27/19 0320  AST 64* 46* 47* 43* 40  ALT 38 36 46* 44 43  ALKPHOS 72 78 82 92 105  BILITOT 0.7 0.7 0.8 0.7 1.2  PROT 7.2 7.0 7.4 6.9 7.1  ALBUMIN 3.0* 3.0* 3.2* 3.3* 3.4*   No results for input(s): LIPASE, AMYLASE in the last 168 hours. No results for input(s): AMMONIA in the last 168 hours. Coagulation Profile: No results for input(s): INR, PROTIME in  the last 168 hours. Cardiac Enzymes: No results for input(s): CKTOTAL, CKMB, CKMBINDEX, TROPONINI in the last 168 hours. BNP (last 3 results) No results for input(s): PROBNP in the last 8760 hours. HbA1C: No results for input(s): HGBA1C in the last 72 hours. CBG: Recent Labs  Lab 11/26/19 1119 11/26/19 1728 11/26/19 2114 11/27/19 0923 11/27/19 1213  GLUCAP 154* 157* 142* 141* 144*   Lipid Profile: No results for input(s): CHOL, HDL, LDLCALC, TRIG, CHOLHDL, LDLDIRECT in the last 72 hours. Thyroid Function Tests: No results for input(s): TSH, T4TOTAL, FREET4, T3FREE, THYROIDAB in the last 72 hours. Anemia Panel: Recent Labs    11/26/19 0247 11/27/19 0320  FERRITIN 548* 503*   Urine analysis: No results found for: COLORURINE, APPEARANCEUR, LABSPEC, PHURINE, GLUCOSEU, HGBUR, BILIRUBINUR, KETONESUR, PROTEINUR, UROBILINOGEN, NITRITE, LEUKOCYTESUR Sepsis Labs: Invalid input(s): PROCALCITONIN, LACTICIDVEN  Recent Results (from the past 240 hour(s))  Respiratory Panel by RT PCR (Flu A&B, Covid) - Nasopharyngeal Swab     Status: Abnormal   Collection Time: 12-Dec-2019  9:00 PM   Specimen: Nasopharyngeal Swab  Result Value Ref Range Status   SARS Coronavirus 2 by RT PCR POSITIVE (A) NEGATIVE Final    Comment: RESULT CALLED TO, READ BACK BY AND VERIFIED WITH: MAYNARD,C AT 2154  ON 11-15-19 BY CHERESNOWSKY,T (NOTE) SARS-CoV-2 target nucleic acids are DETECTED.  SARS-CoV-2 RNA is generally detectable in upper respiratory specimens  during the acute phase of infection. Positive results are indicative of the presence of the identified virus, but do not rule out bacterial infection or co-infection with other pathogens not detected by the test. Clinical correlation with patient history and other diagnostic information is necessary to determine patient infection status. The expected result is Negative.  Fact Sheet for Patients:  https://www.moore.com/  Fact Sheet for Healthcare Providers: https://www.young.biz/  This test is not yet approved or cleared by the [place] FDA and  has been authorized for detection and/or diagnosis of SARS-CoV-2 by FDA under an Emergency Use Authorization (EUA).  This EUA will remain in effect (meaning this t est can be used) for the duration of  the COVID-19 declaration under Section 564(b)(1) of the Act, 21 U.S.C. section 360bbb-3(b)(1), unless the authorization is terminated or revoked sooner.      Influenza A by PCR NEGATIVE NEGATIVE Final   Influenza B by PCR NEGATIVE NEGATIVE Final    Comment: (NOTE) The Xpert Xpress SARS-CoV-2/FLU/RSV assay is intended as an aid in  the diagnosis of influenza from Nasopharyngeal swab specimens and  should not be used as a sole basis for treatment. Nasal washings and  aspirates are unacceptable for Xpert Xpress SARS-CoV-2/FLU/RSV  testing.  Fact Sheet for Patients: https://www.moore.com/  Fact Sheet for Healthcare Providers: https://www.young.biz/  This test is not yet approved or cleared by the [place] FDA and  has been authorized for detection and/or diagnosis of SARS-CoV-2 by  FDA under an Emergency Use Authorization (EUA). This EUA will remain  in effect (meaning this test can be used) for the  duration of the  Covid-19 declaration under Section 564(b)(1) of the Act, 21  U.S.C. section 360bbb-3(b)(1), unless the authorization is  terminated or revoked. Performed at [facility], [address] [place], [place] [address]   Blood Culture (routine x 2)     Status: None   Collection Time: 12-Dec-2019  9:00 PM   Specimen: BLOOD  Result Value Ref Range Status   Specimen Description   Final    BLOOD RIGHT ANTECUBITAL Performed at  [facility] [place], [address] [place], [place] [address]    Special Requests   Final    BOTTLES DRAWN AEROBIC AND ANAEROBIC Blood Culture adequate volume Performed at [facility] [place], [address] [place], [place] [address]    Culture   Final    NO GROWTH 5 DAYS Performed at [facility], [address] [place], [place] [address]    Report Status 11/27/2019 FINAL  Final  Blood Culture (routine x 2)     Status: None   Collection Time: 11/23/2019  9:05 PM   Specimen: BLOOD  Result Value Ref Range Status   Specimen Description   Final    BLOOD LEFT ANTECUBITAL Performed at [facility] [place], [address] [place], [place] [address]    Special Requests   Final    BOTTLES DRAWN AEROBIC AND ANAEROBIC Blood Culture adequate volume Performed at [facility] [place], [address] [place], [place] [address]    Culture   Final    NO GROWTH 5 DAYS Performed at [facility], [address] [place], [place] [address]    Report Status 11/27/2019 FINAL  Final  MRSA PCR Screening     Status: None   Collection Time: 11/22/19 10:11 AM   Specimen: Nasal Mucosa; Nasopharyngeal  Result Value Ref Range Status   MRSA by PCR NEGATIVE NEGATIVE Final    Comment:        The GeneXpert MRSA Assay (FDA approved for NASAL specimens only), is one component of a comprehensive MRSA colonization surveillance program. It is not intended to diagnose MRSA infection nor to guide or monitor treatment for MRSA  infections. Performed at [facility], [address] [place], [place] [address]       Radiology Studies: DG CHEST PORT 1 VIEW  Result Date: 11/26/2019 CLINICAL DATA:  Acute respiratory failure with hypoxia. Patient in prone position. EXAM: PORTABLE CHEST 1 VIEW COMPARISON:  One-view chest x-ray 11/25/2019 FINDINGS: Heart size is normal. Increased interstitial and airspace opacities noted, right greater than left. Right-sided effusion is present. IMPRESSION: 1. Increased interstitial and airspace disease, right greater than left. Findings are compatible with pneumonia. 2. Right-sided effusion. Electronically Signed   By: Marin Robertshristopher  Mattern M.D.   On: 11/26/2019 06:57     Pamella Pertostin Britany Callicott, MD, PhD [facility]  Between 7 am - 7 pm I am available, please contact me via Amion or Securechat  Between 7 pm - 7 am I am not available, please contact night coverage MD/APP via Amion

## 2019-11-28 DIAGNOSIS — J9600 Acute respiratory failure, unspecified whether with hypoxia or hypercapnia: Secondary | ICD-10-CM | POA: Diagnosis not present

## 2019-11-28 DIAGNOSIS — R0602 Shortness of breath: Secondary | ICD-10-CM | POA: Diagnosis not present

## 2019-11-28 DIAGNOSIS — J1282 Pneumonia due to Coronavirus disease 2019: Secondary | ICD-10-CM | POA: Diagnosis not present

## 2019-11-28 DIAGNOSIS — U071 COVID-19: Secondary | ICD-10-CM | POA: Diagnosis not present

## 2019-11-28 LAB — GLUCOSE, CAPILLARY
Glucose-Capillary: 123 mg/dL — ABNORMAL HIGH (ref 70–99)
Glucose-Capillary: 130 mg/dL — ABNORMAL HIGH (ref 70–99)
Glucose-Capillary: 157 mg/dL — ABNORMAL HIGH (ref 70–99)
Glucose-Capillary: 148 mg/dL — ABNORMAL HIGH (ref 70–99)
Glucose-Capillary: 99 mg/dL (ref 70–99)

## 2019-11-28 MED ORDER — HALOPERIDOL LACTATE 5 MG/ML IJ SOLN
2.0000 mg | Freq: Three times a day (TID) | INTRAMUSCULAR | Status: DC | PRN
  Administered 2019-11-28: 2 mg via INTRAVENOUS
  Filled 2019-11-28: qty 1

## 2019-11-28 MED ORDER — LORAZEPAM 2 MG/ML IJ SOLN
0.5000 mg | Freq: Once | INTRAMUSCULAR | Status: AC
  Administered 2019-11-28: 0.5 mg via INTRAVENOUS
  Filled 2019-11-28: qty 1

## 2019-11-28 MED ORDER — ALBUTEROL SULFATE HFA 108 (90 BASE) MCG/ACT IN AERS
2.0000 | INHALATION_SPRAY | Freq: Four times a day (QID) | RESPIRATORY_TRACT | Status: DC
  Administered 2019-11-28 – 2019-11-29 (×2): 2 via RESPIRATORY_TRACT
  Administered 2019-11-29 (×2): 6 via RESPIRATORY_TRACT
  Administered 2019-11-29: 3 via RESPIRATORY_TRACT
  Administered 2019-11-30: 6 via RESPIRATORY_TRACT
  Administered 2019-11-30: 2 via RESPIRATORY_TRACT
  Administered 2019-12-01: 6 via RESPIRATORY_TRACT
  Filled 2019-11-28: qty 6.7

## 2019-11-28 NOTE — Progress Notes (Signed)
PROGRESS NOTE  Swain Acree YKD:[id number] DOB: 1970-07-25 DOA: 12-04-19 PCP: Patient, No Pcp Per   LOS: 6 days   Brief Narrative / Interim history: 49 year old male with history of obesity, reported vaccination for Covid on admission, admitted to the hospital with respiratory failure, chest x-ray showed bilateral infiltrates and he was positive for Covid 19.  He apparently attended the funeral 2 weeks prior to admit and has been exposed there.  He was short of breath for couple of days but suddenly gotten worse the day of admission.  He was 53% on room air requiring supplemental oxygen.  He has been admitted to stepdown, critical care was consulted  Subjective / 24h Interval events: Had an episode of anxiety/panic and possible confusion overnight last night and pulled off his BiPAP, desaturating rapidly.  Received Ativan, got Calmer and tolerated BiPAP well.  He feels well this morning with the BiPAP on, denies any shortness of breath  Assessment & Plan:  Principal Problem Acute Hypoxic Respiratory Failure due to Covid-19 Viral Illness, sepsis ruled in -Continues to remain significantly hypoxemic, concerning events last night that he may have been confused and that he removed his mask.  Remains at risk for intubation.  Vent Mode: PCV;BIPAP FiO2 (%):  [100 %] 100 % Set Rate:  [16 bmp] 16 bmp PEEP:  [10 cmH20] 10 cmH20   -Continue to monitor inflammatory markers, D-dimer jumped to greater than 20 on 10/13, patient too unstable to get a CT angiogram but highly suspect PE.  As clinically he is slightly better, will attempt to do the CT today.  Continue full dose anticoagulation -Status post 5 days of Remdesivir -continue steroids,  -received Actemra x2 on 10/11 and 10/12. -Continue supportive treatment -OK with mild hypoxemia, goal at rest is > 85% SaO2, with movement ideally > 75%  COVID-19 Labs  Recent Labs    11/26/19 0247 11/27/19 0320  DDIMER 13.86* 8.73*  FERRITIN 548*  503*  CRP 0.9 0.6    Lab Results  Component Value Date   SARSCOV2NAA POSITIVE (A) 12-04-19   Active Problems  CBG (last 3)  Recent Labs    11/27/19 2145 11/28/19 0817 11/28/19 0942  GLUCAP 139* 123* 130*   Elevated LFTs -Likely in the setting of Covid infection, LFTs have now normalized  Obesity, BMI 39 -Patient will benefit from weight loss   Scheduled Meds: . albuterol  6 puff Inhalation Q6H  . vitamin C  500 mg Oral Daily  . chlorhexidine  15 mL Mouth Rinse BID  . Chlorhexidine Gluconate Cloth  6 each Topical Daily  . docusate sodium  100 mg Oral Daily  . enoxaparin (LOVENOX) injection  120 mg Subcutaneous Q12H  . furosemide  20 mg Intravenous BID  . insulin aspart  0-15 Units Subcutaneous TID WC  . insulin aspart  0-5 Units Subcutaneous QHS  . insulin aspart  3 Units Subcutaneous TID WC  . linagliptin  5 mg Oral Daily  . mouth rinse  15 mL Mouth Rinse q12n4p  . methylPREDNISolone (SOLU-MEDROL) injection  120 mg Intravenous Q12H  . nystatin  5 mL Oral QID  . polyethylene glycol  17 g Oral Daily  . senna  1 tablet Oral Daily  . zinc sulfate  220 mg Oral Daily   Continuous Infusions: . sodium chloride Stopped (11/23/19 0855)   PRN Meds:.sodium chloride, chlorpheniramine-HYDROcodone, guaiFENesin-dextromethorphan, morphine injection, phenol, saline  DVT prophylaxis: Lovenox Code Status: Full code Family Communication: sister [place] Chelf [phone]  Status is: Inpatient  Remains inpatient appropriate because:Inpatient level of care appropriate due to severity of illness   Dispo: The patient is from: Home              Anticipated d/c is to: Home              Anticipated d/c date is: > 3 days              Patient currently is not medically stable to d/c.  Consultants:  PCCM  Procedures:  2D echo:  1. Left ventricular ejection fraction, by estimation, is 60 to 65%. The left ventricle has normal function. The left ventricle has no regional wall  motion abnormalities. Left ventricular diastolic parameters were normal.  2. Right ventricular systolic function is normal. The right ventricular size is normal.  3. The mitral valve is normal in structure. No evidence of mitral valve regurgitation. No evidence of mitral stenosis.  4. The aortic valve is normal in structure. Aortic valve regurgitation is not visualized. No aortic stenosis is present.  5. The inferior vena cava is normal in size with greater than 50% respiratory variability, suggesting right atrial pressure of 3 mmHg.   Microbiology: None   Antibacterials: None    Objective: Vitals:   11/28/19 0400 11/28/19 0800 11/28/19 0831 11/28/19 0839  BP: (!) 113/50 111/81    Pulse: 87 82 82 78  Resp: (!) 29 (!) 24  (!) 23  Temp: 98.6 F (37 C) (!) 97.4 F (36.3 C)    TempSrc: Axillary Axillary    SpO2: 91% 90%  91%  Weight:      Height:        Intake/Output Summary (Last 24 hours) at 11/28/2019 1010 Last data filed at 11/28/2019 7829 Gross per 24 hour  Intake --  Output 2050 ml  Net -2050 ml   Filed Weights   12/04/2019 2148 11/22/19 0946  Weight: 117.9 kg 118 kg    Examination:  Constitutional: No distress, BiPAP on, a bit sleepy Eyes: No scleral icterus Neck: normal, supple Respiratory: Coarse breath sounds bilaterally, breathing with a BiPAP, no wheezing Cardiovascular: Regular rate and rhythm, no murmurs, no edema Abdomen: Soft, nontender, nondistended, bowel sounds positive Musculoskeletal: no clubbing / cyanosis.  Skin: No rashes appreciated Neurologic: Nonfocal  Data Reviewed: I have independently reviewed following labs and imaging studies   CBC: Recent Labs  Lab 11/23/19 0257 11/24/19 0252 11/25/19 0309 11/26/19 0247 11/27/19 0320  WBC 3.4* 4.8 10.0 15.8* 17.2*  NEUTROABS 2.3 4.0 8.7* 14.1* 15.5*  HGB 13.4 14.2 15.1 14.7 15.3  HCT 40.8 42.7 44.6 43.6 45.9  MCV 93.8 91.6 91.6 91.2 91.6  PLT 202 179 220 232 259   Basic Metabolic  Panel: Recent Labs  Lab 11/23/19 0257 11/24/19 0252 11/25/19 0309 11/26/19 0247 11/27/19 0320  NA 144 140 146* 139 137  K 5.1 4.2 4.5 4.4 5.0  CL 105 105 106 102 98  CO2 26 24 26 24 27   GLUCOSE 130* 142* 153* 151* 151*  BUN 15 24* 29* 24* 25*  CREATININE 0.95 0.98 1.04 1.10 1.13  CALCIUM 8.7* 8.3* 8.7* 8.4* 8.6*   GFR: Estimated Creatinine Clearance: 96.5 mL/min (by C-G formula based on SCr of 1.13 mg/dL). Liver Function Tests: Recent Labs  Lab 11/23/19 0257 11/24/19 0252 11/25/19 0309 11/26/19 0247 11/27/19 0320  AST 64* 46* 47* 43* 40  ALT 38 36 46* 44 43  ALKPHOS 72 78 82 92 105  BILITOT 0.7 0.7 0.8 0.7 1.2  PROT  7.2 7.0 7.4 6.9 7.1  ALBUMIN 3.0* 3.0* 3.2* 3.3* 3.4*   No results for input(s): LIPASE, AMYLASE in the last 168 hours. No results for input(s): AMMONIA in the last 168 hours. Coagulation Profile: No results for input(s): INR, PROTIME in the last 168 hours. Cardiac Enzymes: No results for input(s): CKTOTAL, CKMB, CKMBINDEX, TROPONINI in the last 168 hours. BNP (last 3 results) No results for input(s): PROBNP in the last 8760 hours. HbA1C: No results for input(s): HGBA1C in the last 72 hours. CBG: Recent Labs  Lab 11/27/19 1213 11/27/19 1648 11/27/19 2145 11/28/19 0817 11/28/19 0942  GLUCAP 144* 136* 139* 123* 130*   Lipid Profile: No results for input(s): CHOL, HDL, LDLCALC, TRIG, CHOLHDL, LDLDIRECT in the last 72 hours. Thyroid Function Tests: No results for input(s): TSH, T4TOTAL, FREET4, T3FREE, THYROIDAB in the last 72 hours. Anemia Panel: Recent Labs    11/26/19 0247 11/27/19 0320  FERRITIN 548* 503*   Urine analysis: No results found for: COLORURINE, APPEARANCEUR, LABSPEC, PHURINE, GLUCOSEU, HGBUR, BILIRUBINUR, KETONESUR, PROTEINUR, UROBILINOGEN, NITRITE, LEUKOCYTESUR Sepsis Labs: Invalid input(s): PROCALCITONIN, LACTICIDVEN  Recent Results (from the past 240 hour(s))  Respiratory Panel by RT PCR (Flu A&B, Covid) -  Nasopharyngeal Swab     Status: Abnormal   Collection Time: 12/06/2019  9:00 PM   Specimen: Nasopharyngeal Swab  Result Value Ref Range Status   SARS Coronavirus 2 by RT PCR POSITIVE (A) NEGATIVE Final    Comment: RESULT CALLED TO, READ BACK BY AND VERIFIED WITH: MAYNARD,C AT 2154 ON 11/14/2019 BY CHERESNOWSKY,T (NOTE) SARS-CoV-2 target nucleic acids are DETECTED.  SARS-CoV-2 RNA is generally detectable in upper respiratory specimens  during the acute phase of infection. Positive results are indicative of the presence of the identified virus, but do not rule out bacterial infection or co-infection with other pathogens not detected by the test. Clinical correlation with patient history and other diagnostic information is necessary to determine patient infection status. The expected result is Negative.  Fact Sheet for Patients:  https://www.moore.com/  Fact Sheet for Healthcare Providers: https://www.young.biz/  This test is not yet approved or cleared by the [place] FDA and  has been authorized for detection and/or diagnosis of SARS-CoV-2 by FDA under an Emergency Use Authorization (EUA).  This EUA will remain in effect (meaning this t est can be used) for the duration of  the COVID-19 declaration under Section 564(b)(1) of the Act, 21 U.S.C. section 360bbb-3(b)(1), unless the authorization is terminated or revoked sooner.      Influenza A by PCR NEGATIVE NEGATIVE Final   Influenza B by PCR NEGATIVE NEGATIVE Final    Comment: (NOTE) The Xpert Xpress SARS-CoV-2/FLU/RSV assay is intended as an aid in  the diagnosis of influenza from Nasopharyngeal swab specimens and  should not be used as a sole basis for treatment. Nasal washings and  aspirates are unacceptable for Xpert Xpress SARS-CoV-2/FLU/RSV  testing.  Fact Sheet for Patients: https://www.moore.com/  Fact Sheet for Healthcare  Providers: https://www.young.biz/  This test is not yet approved or cleared by the [place] FDA and  has been authorized for detection and/or diagnosis of SARS-CoV-2 by  FDA under an Emergency Use Authorization (EUA). This EUA will remain  in effect (meaning this test can be used) for the duration of the  Covid-19 declaration under Section 564(b)(1) of the Act, 21  U.S.C. section 360bbb-3(b)(1), unless the authorization is  terminated or revoked. Performed at [facility], [address] [place], [place] [address]  Blood Culture (routine x 2)     Status: None   Collection Time: 11/17/2019  9:00 PM   Specimen: BLOOD  Result Value Ref Range Status   Specimen Description   Final    BLOOD RIGHT ANTECUBITAL Performed at [facility] [place], [address] [place], [place] [address]    Special Requests   Final    BOTTLES DRAWN AEROBIC AND ANAEROBIC Blood Culture adequate volume Performed at [facility] [place], [address] [place], [place] [address]    Culture   Final    NO GROWTH 5 DAYS Performed at [facility], [address] [place], [place] [address]    Report Status 11/27/2019 FINAL  Final  Blood Culture (routine x 2)     Status: None   Collection Time: 12/08/2019  9:05 PM   Specimen: BLOOD  Result Value Ref Range Status   Specimen Description   Final    BLOOD LEFT ANTECUBITAL Performed at [facility] [place], [address] [place], [place] [address]    Special Requests   Final    BOTTLES DRAWN AEROBIC AND ANAEROBIC Blood Culture adequate volume Performed at [facility] [place], [address] [place], [place] [address]    Culture   Final    NO GROWTH 5 DAYS Performed at [facility], [address] [place], [place] [address]    Report Status 11/27/2019 FINAL  Final  MRSA PCR Screening     Status: None   Collection Time: 11/22/19 10:11 AM   Specimen: Nasal Mucosa; Nasopharyngeal  Result Value Ref  Range Status   MRSA by PCR NEGATIVE NEGATIVE Final    Comment:        The GeneXpert MRSA Assay (FDA approved for NASAL specimens only), is one component of a comprehensive MRSA colonization surveillance program. It is not intended to diagnose MRSA infection nor to guide or monitor treatment for MRSA infections. Performed at [facility], [address] [place], [place] [address]       Radiology Studies: No results found.   Pamella Pertostin Colby Reels, MD, PhD [facility]  Between 7 am - 7 pm I am available, please contact me via Amion or Securechat  Between 7 pm - 7 am I am not available, please contact night coverage MD/APP via Amion

## 2019-11-28 NOTE — Progress Notes (Signed)
Dr. Stevphen Meuse made aware pt maxxed on [facility] w/ NRB on top w/ sats high 70s to low 80s. Pt states he feels fine and is able to converse in full sentences without signs of dyspnea.

## 2019-11-28 NOTE — Progress Notes (Addendum)
Patient's oxygen level continues to desat into the 60's.  Charge RN placed patient on BiPAP.  Patient immediately tried to remove the mask and said "I can't take it".  BiPAP mask was replaced with HHFNC and NRB.  Patient's oxygen level returned to the low 80's.

## 2019-11-29 DIAGNOSIS — U071 COVID-19: Secondary | ICD-10-CM | POA: Diagnosis not present

## 2019-11-29 DIAGNOSIS — R0602 Shortness of breath: Secondary | ICD-10-CM | POA: Diagnosis not present

## 2019-11-29 DIAGNOSIS — J9600 Acute respiratory failure, unspecified whether with hypoxia or hypercapnia: Secondary | ICD-10-CM | POA: Diagnosis not present

## 2019-11-29 DIAGNOSIS — J80 Acute respiratory distress syndrome: Secondary | ICD-10-CM

## 2019-11-29 DIAGNOSIS — J1282 Pneumonia due to Coronavirus disease 2019: Secondary | ICD-10-CM | POA: Diagnosis not present

## 2019-11-29 DIAGNOSIS — R0902 Hypoxemia: Secondary | ICD-10-CM | POA: Diagnosis not present

## 2019-11-29 LAB — GLUCOSE, CAPILLARY
Glucose-Capillary: 131 mg/dL — ABNORMAL HIGH (ref 70–99)
Glucose-Capillary: 190 mg/dL — ABNORMAL HIGH (ref 70–99)
Glucose-Capillary: 157 mg/dL — ABNORMAL HIGH (ref 70–99)
Glucose-Capillary: 135 mg/dL — ABNORMAL HIGH (ref 70–99)

## 2019-11-29 MED ORDER — HYDROXYZINE HCL 50 MG/ML IM SOLN
50.0000 mg | Freq: Once | INTRAMUSCULAR | Status: AC
  Administered 2019-11-29: 50 mg via INTRAMUSCULAR
  Filled 2019-11-29: qty 1

## 2019-11-29 MED ORDER — METHYLPREDNISOLONE SODIUM SUCC 125 MG IJ SOLR
80.0000 mg | Freq: Two times a day (BID) | INTRAMUSCULAR | Status: DC
  Administered 2019-11-29 – 2019-12-01 (×4): 80 mg via INTRAVENOUS
  Filled 2019-11-29 (×4): qty 2

## 2019-11-29 NOTE — Progress Notes (Signed)
ANTICOAGULATION CONSULT NOTE   Pharmacy Consult for lovenox Indication: r/o pulmonary embolus  Allergies  Allergen Reactions  . Codeine Rash  . Anacin-3 [Acetaminophen]     Tylenol 3    Patient Measurements: Height: 5\' 8"  (172.7 cm) Weight: 118 kg (260 lb 2.3 oz) IBW/kg (Calculated) : 68.4 Heparin Dosing Weight:   Vital Signs: Temp: 97.9 F (36.6 C) (10/18 0801) Temp Source: Axillary (10/18 0801) BP: 122/69 (10/18 0700) Pulse Rate: 82 (10/18 0700)  Labs: Recent Labs    11/27/19 0320  HGB 15.3  HCT 45.9  PLT 259  CREATININE 1.13    Estimated Creatinine Clearance: 96.5 mL/min (by C-G formula based on SCr of 1.13 mg/dL).   Assessment: Patient is a 49 y.o M presented to the ED on 10/10 with c/o SOB and subsequently tested positive for COVID-19.  He was started on LMWH prophylaxis dose on admission.  Ddimer increased to >20 on 10/13.  Pharmacy consulted to increase LMWH to full dose on 10/13 for r/o PE.  - 10/12 LE doppler: neg for DVT  Today, 11/29/2019: - cbc stable- last labs 10/16 - no bleeding documented - SCr 1.13 on 10/16 - D dimer down to 8.75 on 10/16 - unable to perform chest CTA at this time d/t pt's clinical status  Goal of Therapy:  Anti-Xa level 0.6-1 units/ml 4hrs after LMWH dose given Monitor platelets by anticoagulation protocol: Yes   Plan:  - continue lovenox dose to 120 mg SQ q12h - monitor for s/sx bleeding - f/u chest CTA results if/when able to transport patient down for procedure CBC & SCr q72 hrs, next due 10/19  11/19, Pharm.D 11/29/2019 10:43 AM

## 2019-11-29 NOTE — Progress Notes (Signed)
Pt moved from 1236 to 1237.  Pt placed on BiPAP for duration of move and placed back on HHFNC & NRB combination once it was moved into 1237 and hooked up.  Pt remained stable throughout move.

## 2019-11-29 NOTE — Progress Notes (Signed)
PROGRESS NOTE  Janae SauceDavid Crislip ZOX:[id number] DOB: 06-07-1970 DOA: 05/29/19 PCP: Patient, No Pcp Per   LOS: 7 days   Brief Narrative / Interim history: 49 year old male with history of obesity, reported vaccination for Covid on admission, admitted to the hospital with respiratory failure, chest x-ray showed bilateral infiltrates and he was positive for Covid 19.  He apparently attended the funeral 2 weeks prior to admit and has been exposed there.  He was short of breath for couple of days but suddenly gotten worse the day of admission.  He was 53% on room air requiring supplemental oxygen.  He has been admitted to stepdown, critical care was consulted  Subjective / 24h Interval events: Another episode of anxiety and panic last night, did not respond to Haldol but improved after Atarax.  A bit sleepy this morning on my evaluation  Assessment & Plan:  Principal Problem Acute Hypoxic Respiratory Failure due to Covid-19 Viral Illness, sepsis ruled in -Continues to remain significantly hypoxemic, complicating factor is confusion from couple nights ago and intermittent panic attacks  Vent Mode: BIPAP;PCV FiO2 (%):  [100 %] 100 % Set Rate:  [16 bmp] 16 bmp PEEP:  [10 cmH20] 10 cmH20   -Continue to monitor inflammatory markers, D-dimer jumped to greater than 20 on 10/13, patient too unstable to get a CT angiogram but highly suspect PE.  Unstable for CT angiogram, continue full dose anticoagulation -Status post 5 days of Remdesivir -continue steroids,  -received Actemra x2 on 10/11 and 10/12. -Continue supportive treatment -OK with mild hypoxemia, goal at rest is > 85% SaO2, with movement ideally > 75% -Appreciate PCCM follow-up, discussed with critical care team today  COVID-19 Labs  Recent Labs    11/27/19 0320  DDIMER 8.73*  FERRITIN 503*  CRP 0.6    Lab Results  Component Value Date   SARSCOV2NAA POSITIVE (A) 05/29/19   Active Problems  CBG (last 3)  Recent Labs     11/28/19 1556 11/28/19 2107 11/29/19 0816  GLUCAP 148* 99 131*   Elevated LFTs -Likely in the setting of Covid infection, LFTs have now normalized  Obesity, BMI 39 -Patient will benefit from weight loss   Scheduled Meds: . albuterol  2-6 puff Inhalation Q6H  . vitamin C  500 mg Oral Daily  . chlorhexidine  15 mL Mouth Rinse BID  . Chlorhexidine Gluconate Cloth  6 each Topical Daily  . docusate sodium  100 mg Oral Daily  . enoxaparin (LOVENOX) injection  120 mg Subcutaneous Q12H  . furosemide  20 mg Intravenous BID  . insulin aspart  0-15 Units Subcutaneous TID WC  . insulin aspart  0-5 Units Subcutaneous QHS  . insulin aspart  3 Units Subcutaneous TID WC  . linagliptin  5 mg Oral Daily  . mouth rinse  15 mL Mouth Rinse q12n4p  . methylPREDNISolone (SOLU-MEDROL) injection  120 mg Intravenous Q12H  . nystatin  5 mL Oral QID  . polyethylene glycol  17 g Oral Daily  . senna  1 tablet Oral Daily  . zinc sulfate  220 mg Oral Daily   Continuous Infusions: . sodium chloride Stopped (11/23/19 0855)   PRN Meds:.sodium chloride, chlorpheniramine-HYDROcodone, guaiFENesin-dextromethorphan, haloperidol lactate, morphine injection, phenol, saline  DVT prophylaxis: Lovenox Code Status: Full code Family Communication: sister Sharyon CableDoris Halliday [phone] on 10/17  Status is: Inpatient  Remains inpatient appropriate because:Inpatient level of care appropriate due to severity of illness   Dispo: The patient is from: Home  Anticipated d/c is to: Home              Anticipated d/c date is: > 3 days              Patient currently is not medically stable to d/c.  Consultants:  PCCM  Procedures:  2D echo:  1. Left ventricular ejection fraction, by estimation, is 60 to 65%. The left ventricle has normal function. The left ventricle has no regional wall motion abnormalities. Left ventricular diastolic parameters were normal.  2. Right ventricular systolic function is normal.  The right ventricular size is normal.  3. The mitral valve is normal in structure. No evidence of mitral valve regurgitation. No evidence of mitral stenosis.  4. The aortic valve is normal in structure. Aortic valve regurgitation is not visualized. No aortic stenosis is present.  5. The inferior vena cava is normal in size with greater than 50% respiratory variability, suggesting right atrial pressure of 3 mmHg.   Microbiology: None   Antibacterials: None    Objective: Vitals:   11/29/19 0700 11/29/19 0801 11/29/19 0803 11/29/19 0904  BP: 122/69     Pulse: 82     Resp: (!) 25     Temp:  97.9 F (36.6 C)    TempSrc:  Axillary    SpO2: 91% 91% 91% (!) 88%  Weight:      Height:        Intake/Output Summary (Last 24 hours) at 11/29/2019 1037 Last data filed at 11/29/2019 0650 Gross per 24 hour  Intake --  Output 700 ml  Net -700 ml   Filed Weights   12/19/19 2148 11/22/19 0946  Weight: 117.9 kg 118 kg    Examination:  Constitutional: No distress, on BiPAP, Neck: normal, supple Respiratory: Coarse breath sounds bilaterally, breathing with the BiPAP, no wheezing, no crackles Cardiovascular: Regular rate and rhythm, no murmurs, no peripheral edema Abdomen: Soft, nontender, nondistended, positive bowel sounds Musculoskeletal: no clubbing / cyanosis.  Skin: No rashes appreciated Neurologic: No focal deficits  Data Reviewed: I have independently reviewed following labs and imaging studies   CBC: Recent Labs  Lab 11/23/19 0257 11/24/19 0252 11/25/19 0309 11/26/19 0247 11/27/19 0320  WBC 3.4* 4.8 10.0 15.8* 17.2*  NEUTROABS 2.3 4.0 8.7* 14.1* 15.5*  HGB 13.4 14.2 15.1 14.7 15.3  HCT 40.8 42.7 44.6 43.6 45.9  MCV 93.8 91.6 91.6 91.2 91.6  PLT 202 179 220 232 259   Basic Metabolic Panel: Recent Labs  Lab 11/23/19 0257 11/24/19 0252 11/25/19 0309 11/26/19 0247 11/27/19 0320  NA 144 140 146* 139 137  K 5.1 4.2 4.5 4.4 5.0  CL 105 105 106 102 98  CO2 26  24 26 24 27   GLUCOSE 130* 142* 153* 151* 151*  BUN 15 24* 29* 24* 25*  CREATININE 0.95 0.98 1.04 1.10 1.13  CALCIUM 8.7* 8.3* 8.7* 8.4* 8.6*   GFR: Estimated Creatinine Clearance: 96.5 mL/min (by C-G formula based on SCr of 1.13 mg/dL). Liver Function Tests: Recent Labs  Lab 11/23/19 0257 11/24/19 0252 11/25/19 0309 11/26/19 0247 11/27/19 0320  AST 64* 46* 47* 43* 40  ALT 38 36 46* 44 43  ALKPHOS 72 78 82 92 105  BILITOT 0.7 0.7 0.8 0.7 1.2  PROT 7.2 7.0 7.4 6.9 7.1  ALBUMIN 3.0* 3.0* 3.2* 3.3* 3.4*   No results for input(s): LIPASE, AMYLASE in the last 168 hours. No results for input(s): AMMONIA in the last 168 hours. Coagulation Profile: No results for input(s): INR,  PROTIME in the last 168 hours. Cardiac Enzymes: No results for input(s): CKTOTAL, CKMB, CKMBINDEX, TROPONINI in the last 168 hours. BNP (last 3 results) No results for input(s): PROBNP in the last 8760 hours. HbA1C: No results for input(s): HGBA1C in the last 72 hours. CBG: Recent Labs  Lab 11/28/19 0942 11/28/19 1201 11/28/19 1556 11/28/19 2107 11/29/19 0816  GLUCAP 130* 157* 148* 99 131*   Lipid Profile: No results for input(s): CHOL, HDL, LDLCALC, TRIG, CHOLHDL, LDLDIRECT in the last 72 hours. Thyroid Function Tests: No results for input(s): TSH, T4TOTAL, FREET4, T3FREE, THYROIDAB in the last 72 hours. Anemia Panel: Recent Labs    11/27/19 0320  FERRITIN 503*   Urine analysis: No results found for: COLORURINE, APPEARANCEUR, LABSPEC, PHURINE, GLUCOSEU, HGBUR, BILIRUBINUR, KETONESUR, PROTEINUR, UROBILINOGEN, NITRITE, LEUKOCYTESUR Sepsis Labs: Invalid input(s): PROCALCITONIN, LACTICIDVEN  Recent Results (from the past 240 hour(s))  Respiratory Panel by RT PCR (Flu A&B, Covid) - Nasopharyngeal Swab     Status: Abnormal   Collection Time: 12/04/2019  9:00 PM   Specimen: Nasopharyngeal Swab  Result Value Ref Range Status   SARS Coronavirus 2 by RT PCR POSITIVE (A) NEGATIVE Final    Comment:  RESULT CALLED TO, READ BACK BY AND VERIFIED WITH: MAYNARD,C AT 2154 ON 12/11/2019 BY CHERESNOWSKY,T (NOTE) SARS-CoV-2 target nucleic acids are DETECTED.  SARS-CoV-2 RNA is generally detectable in upper respiratory specimens  during the acute phase of infection. Positive results are indicative of the presence of the identified virus, but do not rule out bacterial infection or co-infection with other pathogens not detected by the test. Clinical correlation with patient history and other diagnostic information is necessary to determine patient infection status. The expected result is Negative.  Fact Sheet for Patients:  https://www.moore.com/  Fact Sheet for Healthcare Providers: https://www.young.biz/  This test is not yet approved or cleared by the [place] FDA and  has been authorized for detection and/or diagnosis of SARS-CoV-2 by FDA under an Emergency Use Authorization (EUA).  This EUA will remain in effect (meaning this t est can be used) for the duration of  the COVID-19 declaration under Section 564(b)(1) of the Act, 21 U.S.C. section 360bbb-3(b)(1), unless the authorization is terminated or revoked sooner.      Influenza A by PCR NEGATIVE NEGATIVE Final   Influenza B by PCR NEGATIVE NEGATIVE Final    Comment: (NOTE) The Xpert Xpress SARS-CoV-2/FLU/RSV assay is intended as an aid in  the diagnosis of influenza from Nasopharyngeal swab specimens and  should not be used as a sole basis for treatment. Nasal washings and  aspirates are unacceptable for Xpert Xpress SARS-CoV-2/FLU/RSV  testing.  Fact Sheet for Patients: https://www.moore.com/  Fact Sheet for Healthcare Providers: https://www.young.biz/  This test is not yet approved or cleared by the [place] FDA and  has been authorized for detection and/or diagnosis of SARS-CoV-2 by  FDA under an Emergency Use Authorization (EUA). This  EUA will remain  in effect (meaning this test can be used) for the duration of the  Covid-19 declaration under Section 564(b)(1) of the Act, 21  U.S.C. section 360bbb-3(b)(1), unless the authorization is  terminated or revoked. Performed at [facility], [address] [place], [place] [address]   Blood Culture (routine x 2)     Status: None   Collection Time: 11/22/2019  9:00 PM   Specimen: BLOOD  Result Value Ref Range Status   Specimen Description   Final    BLOOD RIGHT ANTECUBITAL Performed at [facility], [address] [place], [place] [address]    Special Requests   Final    BOTTLES DRAWN AEROBIC AND ANAEROBIC Blood Culture adequate volume Performed at [facility], [address] [place], [place] [address]    Culture   Final    NO GROWTH 5 DAYS Performed at [facility], [address] [place], [place] [address]    Report Status 11/27/2019 FINAL  Final  Blood Culture (routine x 2)     Status: None   Collection Time: 17-Dec-2019  9:05 PM   Specimen: BLOOD  Result Value Ref Range Status   Specimen Description   Final    BLOOD LEFT ANTECUBITAL Performed at [facility], [address] [place], [place] [address]    Special Requests   Final    BOTTLES DRAWN AEROBIC AND ANAEROBIC Blood Culture adequate volume Performed at [facility], [address] [place], [place] [address]    Culture   Final    NO GROWTH 5 DAYS Performed at [facility], [address] [place], [place] [address]    Report Status 11/27/2019 FINAL  Final  MRSA PCR Screening     Status: None   Collection Time: 11/22/19 10:11 AM   Specimen: Nasal Mucosa; Nasopharyngeal  Result Value Ref Range Status   MRSA by PCR NEGATIVE NEGATIVE Final    Comment:        The GeneXpert MRSA Assay (FDA approved for NASAL specimens only), is one component of a comprehensive MRSA colonization surveillance program. It is not intended to diagnose  MRSA infection nor to guide or monitor treatment for MRSA infections. Performed at [facility], [address] [place], [place] [address]       Radiology Studies: No results found.   Pamella Pert, MD, PhD [facility]  Between 7 am - 7 pm I am available, please contact me via Amion or Securechat  Between 7 pm - 7 am I am not available, please contact night coverage MD/APP via Amion

## 2019-11-29 NOTE — TOC Progression Note (Signed)
Transition of Care [facility]) - Progression Note    Patient Details  Name: Stuart Singh MRN: [id number] Date of Birth: Feb 03, 1971  [facility] of [facility]) CM/SW Contact  Golda Acre, RN Phone Number: 11/29/2019, 8:48 AM  Clinical Narrative:    bipap at 100% fi02, iv solu medrol, bld culture x2x5days-neg,  Following for progression and toc needs. covid positive patient.   Expected Discharge Plan: Home w Home Health Services Barriers to Discharge: Barriers Unresolved (comment)  Expected Discharge Plan and Services Expected Discharge Plan: Home w Home Health Services   Discharge Planning Services: CM Consult   Living arrangements for the past 2 months: Single Family Home                                       Social Determinants of Health (SDOH) Interventions    Readmission Risk Interventions No flowsheet data found.

## 2019-11-29 NOTE — Progress Notes (Addendum)
   NAME:  Stuart Singh, MRN:  [id number], DOB:  25-Feb-1970, LOS: 7 ADMISSION DATE:  December 17, 2019, CONSULTATION DATE:  10/11 REFERRING MD:  Dr. Ronaldo Miyamoto CHIEF COMPLAINT:  SOB, COVID PNA   Brief History   49 y/o M admitted with acute hypoxic respiratory failure with bilateral infiltrates in the setting of COVID PNA. Required heated HFNC, intermittent BiPAP.    Past Medical History  COVID  Significant Hospital Events   10/10 Admit with COVID PNA   10/12 On bipap 10/14 60L flow, 100% FiO2 10/18 70L flow, 100% FiO2 + NRB  Consults:      Procedures:     Significant Diagnostic Tests:  10/12 ECHO >> LVEF 60-65%, no RWMA, RV systolic function normal  Micro Data:  COVID 10/10 >> positive  Influenza A/B 10/10 >> negative BCx2 10/10 >> negative  MRSA PCR 10/10 >> negative   Antimicrobials/COVID RX  Tociluzimab X 2- 10/11, 10/12 >> Remdesivir 10/12 >> 10/14 Solumedrol 10/12 >>  Interim history/subjective:  RN reports pt wore bipap until ~0300.  Pt not eating full meals, using ensure. Remains on heated high flow + NRB  Afebrile  Glucose range 99 -150  Objective   Blood pressure 122/69, pulse 82, temperature 97.9 F (36.6 C), temperature source Axillary, resp. rate (!) 25, height 5\' 8"  (1.727 m), weight 118 kg, SpO2 (!) 88 %.    Vent Mode: BIPAP;PCV FiO2 (%):  [100 %] 100 % Set Rate:  [16 bmp] 16 bmp PEEP:  [10 cmH20] 10 cmH20   Intake/Output Summary (Last 24 hours) at 11/29/2019 1012 Last data filed at 11/29/2019 0650 Gross per 24 hour  Intake --  Output 700 ml  Net -700 ml   Filed Weights   Dec 17, 2019 2148 11/22/19 0946  Weight: 117.9 kg 118 kg    Examination: General: adult male sitting up in bed in NAD HEENT: MM pink/moist, [place] O2 + NRB Neuro: Awake, alert / oriented to self / place, time, events.  MAE CV: s1s2 RRR, no m/r/g PULM:  Tachypnea but not labored, lungs bilaterally clear  GI: soft, bsx4 active  Extremities: warm/dry, no edema, SCD's in place Skin: no  rashes or lesions  Resolved Hospital Problem list      Assessment & Plan:   Acute Hypoxic Respiratory Failure in setting of Diffuse Bilateral Infiltrates secondary to COVID PNA Completed remdesivir, recieved Actemra 10/11, 10/12.   -wean O2 for sats >85% -prone positioning as able  -reduce solumedrol to 80 mg IV BID, with taper  -follow intermittent CXR  -pulmonary hygiene  -lasix 20 mg BID > negative balance currently  -follow inflammatory markers  Elevated D-Dimer, Rule Out PE D-dimer increased to > 20.  LE duplex and ECHO negative.  -continue therapeutic LMWH  Agitated Delirium  Suspect steroids and hypoxia are playing role here -minimize sedating medications as able -consider steroid reduction  -promote sleep/wake cycle   Best practice:  Diet: Regular, as tolerated  Pain/Anxiety/Delirium protocol (if indicated): n/a  VAP protocol (if indicated): n/a  DVT prophylaxis: Lovenox  GI prophylaxis: n/a Glucose control: per primary  Mobility: as tolerated Code Status: Full Code per patient Family Communication: Per primary Disposition: per primary    CC Time: n/a  12/12, MSN, NP-C, AGACNP-BC [facility] 11/29/2019, 10:18 AM   Please see [email or web address] for pager details.

## 2019-11-29 NOTE — Progress Notes (Signed)
Paged [facility] to inform them of patient desatting into the 60's.  Explained patient continues to be anxious.  Haldol was administered, but not effective.  Received an order for Vistaril IM 50mg .  After it was administered, patient began to relax and agreed to wear BiPAP mask.  Patient is now resting comfortably with O2 sats in the low 90's.

## 2019-11-30 ENCOUNTER — Inpatient Hospital Stay (HOSPITAL_COMMUNITY): Payer: BC Managed Care – PPO

## 2019-11-30 DIAGNOSIS — U071 COVID-19: Secondary | ICD-10-CM | POA: Diagnosis not present

## 2019-11-30 DIAGNOSIS — J80 Acute respiratory distress syndrome: Secondary | ICD-10-CM | POA: Diagnosis not present

## 2019-11-30 DIAGNOSIS — J1282 Pneumonia due to Coronavirus disease 2019: Secondary | ICD-10-CM | POA: Diagnosis not present

## 2019-11-30 DIAGNOSIS — R0602 Shortness of breath: Secondary | ICD-10-CM | POA: Diagnosis not present

## 2019-11-30 DIAGNOSIS — J9600 Acute respiratory failure, unspecified whether with hypoxia or hypercapnia: Secondary | ICD-10-CM | POA: Diagnosis not present

## 2019-11-30 LAB — D-DIMER, QUANTITATIVE: D-Dimer, Quant: 3.66 ug/mL-FEU — ABNORMAL HIGH (ref 0.00–0.50)

## 2019-11-30 LAB — COMPREHENSIVE METABOLIC PANEL
ALT: 52 U/L — ABNORMAL HIGH (ref 0–44)
AST: 33 U/L (ref 15–41)
Albumin: 3.5 g/dL (ref 3.5–5.0)
Alkaline Phosphatase: 122 U/L (ref 38–126)
Anion gap: 13 (ref 5–15)
BUN: 43 mg/dL — ABNORMAL HIGH (ref 6–20)
CO2: 26 mmol/L (ref 22–32)
Calcium: 8.4 mg/dL — ABNORMAL LOW (ref 8.9–10.3)
Chloride: 97 mmol/L — ABNORMAL LOW (ref 98–111)
Creatinine, Ser: 1.21 mg/dL (ref 0.61–1.24)
GFR, Estimated: >60 mL/min (ref >60)
Glucose, Bld: 147 mg/dL — ABNORMAL HIGH (ref 70–99)
Potassium: 4.4 mmol/L (ref 3.5–5.1)
Sodium: 136 mmol/L (ref 135–145)
Total Bilirubin: 1.1 mg/dL (ref 0.3–1.2)
Total Protein: 6.8 g/dL (ref 6.5–8.1)

## 2019-11-30 LAB — GLUCOSE, CAPILLARY
Glucose-Capillary: 148 mg/dL — ABNORMAL HIGH (ref 70–99)
Glucose-Capillary: 218 mg/dL — ABNORMAL HIGH (ref 70–99)
Glucose-Capillary: 208 mg/dL — ABNORMAL HIGH (ref 70–99)
Glucose-Capillary: 119 mg/dL — ABNORMAL HIGH (ref 70–99)

## 2019-11-30 LAB — C-REACTIVE PROTEIN: CRP: <0.5 mg/dL (ref <1.0)

## 2019-11-30 LAB — PHOSPHORUS: Phosphorus: 4.7 mg/dL — ABNORMAL HIGH (ref 2.5–4.6)

## 2019-11-30 LAB — MAGNESIUM: Magnesium: 3.5 mg/dL — ABNORMAL HIGH (ref 1.7–2.4)

## 2019-11-30 MED ORDER — ROCURONIUM BROMIDE 10 MG/ML (PF) SYRINGE
PREFILLED_SYRINGE | INTRAVENOUS | Status: AC
  Filled 2019-11-30: qty 10

## 2019-11-30 MED ORDER — ETOMIDATE 2 MG/ML IV SOLN
INTRAVENOUS | Status: AC
  Filled 2019-11-30: qty 20

## 2019-11-30 MED ORDER — PHENYLEPHRINE 40 MCG/ML (10ML) SYRINGE FOR IV PUSH (FOR BLOOD PRESSURE SUPPORT)
PREFILLED_SYRINGE | INTRAVENOUS | Status: AC
  Filled 2019-11-30: qty 10

## 2019-11-30 MED ORDER — FENTANYL CITRATE (PF) 100 MCG/2ML IJ SOLN
INTRAMUSCULAR | Status: AC
  Filled 2019-11-30: qty 2

## 2019-11-30 MED ORDER — MIDAZOLAM HCL 2 MG/2ML IJ SOLN
INTRAMUSCULAR | Status: AC
  Filled 2019-11-30: qty 4

## 2019-11-30 NOTE — Progress Notes (Signed)
PROGRESS NOTE  Janae SauceDavid Erisman ZOX:[id number] DOB: May 14, 1970 DOA: 12/12/2019 PCP: Patient, No Pcp Per   LOS: 8 days   Brief Narrative / Interim history: 49 year old male with history of obesity, reported vaccination for Covid on admission, admitted to the hospital with respiratory failure, chest x-ray showed bilateral infiltrates and he was positive for Covid 19.  He apparently attended the funeral 2 weeks prior to admit and has been exposed there.  He was short of breath for couple of days but suddenly gotten worse the day of admission.  He was 53% on room air requiring supplemental oxygen.  He has been admitted to stepdown, critical care was consulted.  Patient's respiratory status has remained tenuous, on and off BiPAP, proning, and had 2 nights on 10/16 and 10/17 where he had confusion/panic attacks.  Subjective / 24h Interval events: No further anxiety episodes last night.  He is prone this morning, nonrebreather on, satting in the mid 80s.  He tells me he feels comfortable, denies any chest pain  Assessment & Plan:  Principal Problem Acute Hypoxic Respiratory Failure due to Covid-19 Viral Illness, sepsis ruled in -Remains significantly hypoxemic, intermittent anxiety episodes and at one point he removed his BiPAP.  Remains at high risk for intubation, critical care following.  FiO2 (%):  [100 %] 100 %   -Continue to monitor inflammatory markers, D-dimer jumped to greater than 20 on 10/13, patient too unstable to get a CT angiogram but highly suspect PE.  Unstable for CT angiogram, continue full dose anticoagulation -Completed 5 days of remdesivir, continue steroids, patient is status post Actemra on 10/11 and repeat dose on 10/12 -OK with mild hypoxemia, goal at rest is > 85% SaO2, with movement ideally > 75% -Appreciate PCCM follow-up  COVID-19 Labs  Recent Labs    11/30/19 0307  DDIMER 3.66*  CRP <0.5    Lab Results  Component Value Date   SARSCOV2NAA POSITIVE (A)  11/25/2019   Active Problems  CBG (last 3)  Recent Labs    11/29/19 1544 11/29/19 2215 11/30/19 0739  GLUCAP 157* 135* 148*   Elevated LFTs -Likely in the setting of Covid infection, LFTs improved, stable  Obesity, BMI 39 -Patient will benefit from weight loss   Scheduled Meds: . albuterol  2-6 puff Inhalation Q6H  . vitamin C  500 mg Oral Daily  . chlorhexidine  15 mL Mouth Rinse BID  . Chlorhexidine Gluconate Cloth  6 each Topical Daily  . docusate sodium  100 mg Oral Daily  . enoxaparin (LOVENOX) injection  120 mg Subcutaneous Q12H  . furosemide  20 mg Intravenous BID  . insulin aspart  0-15 Units Subcutaneous TID WC  . insulin aspart  0-5 Units Subcutaneous QHS  . insulin aspart  3 Units Subcutaneous TID WC  . linagliptin  5 mg Oral Daily  . mouth rinse  15 mL Mouth Rinse q12n4p  . methylPREDNISolone (SOLU-MEDROL) injection  80 mg Intravenous Q12H  . nystatin  5 mL Oral QID  . polyethylene glycol  17 g Oral Daily  . senna  1 tablet Oral Daily  . zinc sulfate  220 mg Oral Daily   Continuous Infusions: . sodium chloride Stopped (11/23/19 0855)   PRN Meds:.sodium chloride, chlorpheniramine-HYDROcodone, guaiFENesin-dextromethorphan, haloperidol lactate, morphine injection, phenol, saline  DVT prophylaxis: Lovenox Code Status: Full code Family Communication: sister Sharyon CableDoris Huizenga [phone]   Status is: Inpatient  Remains inpatient appropriate because:Inpatient level of care appropriate due to severity of illness   Dispo: The patient is  from: Home              Anticipated d/c is to: Home              Anticipated d/c date is: > 3 days              Patient currently is not medically stable to d/c.  Consultants:  PCCM  Procedures:  2D echo:  1. Left ventricular ejection fraction, by estimation, is 60 to 65%. The left ventricle has normal function. The left ventricle has no regional wall motion abnormalities. Left ventricular diastolic parameters were  normal.  2. Right ventricular systolic function is normal. The right ventricular size is normal.  3. The mitral valve is normal in structure. No evidence of mitral valve regurgitation. No evidence of mitral stenosis.  4. The aortic valve is normal in structure. Aortic valve regurgitation is not visualized. No aortic stenosis is present.  5. The inferior vena cava is normal in size with greater than 50% respiratory variability, suggesting right atrial pressure of 3 mmHg.   Microbiology: None   Antibacterials: None    Objective: Vitals:   11/30/19 0700 11/30/19 0730 11/30/19 0800 11/30/19 0803  BP: 117/77  139/84   Pulse: 89     Resp:      Temp:  (!) 96.2 F (35.7 C)    TempSrc:  Axillary    SpO2: (!) 80%   (!) 87%  Weight:      Height:        Intake/Output Summary (Last 24 hours) at 11/30/2019 0858 Last data filed at 11/30/2019 0600 Gross per 24 hour  Intake --  Output 1725 ml  Net -1725 ml   Filed Weights   12-18-2019 2148 11/22/19 0946  Weight: 117.9 kg 118 kg    Examination:  Constitutional: Prone, nonrebreather on, appears comfortable Neck: normal, supple Respiratory: Coarse breath sounds bilaterally, diminished air excursion on the right lung field, no wheezing, tachypneic Cardiovascular: Regular rate and rhythm, no murmurs appreciated.  No peripheral edema Abdomen: Bowel sounds positive, deferred full exam since he is prone Musculoskeletal: no clubbing / cyanosis.  Skin: No rashes appreciated Neurologic: Moves all 4 extremities independently, no focal deficits, alert and oriented x4  Data Reviewed: I have independently reviewed following labs and imaging studies   CBC: Recent Labs  Lab 11/24/19 0252 11/25/19 0309 11/26/19 0247 11/27/19 0320  WBC 4.8 10.0 15.8* 17.2*  NEUTROABS 4.0 8.7* 14.1* 15.5*  HGB 14.2 15.1 14.7 15.3  HCT 42.7 44.6 43.6 45.9  MCV 91.6 91.6 91.2 91.6  PLT 179 220 232 259   Basic Metabolic Panel: Recent Labs  Lab  11/24/19 0252 11/25/19 0309 11/26/19 0247 11/27/19 0320 11/30/19 0307  NA 140 146* 139 137 136  K 4.2 4.5 4.4 5.0 4.4  CL 105 106 102 98 97*  CO2 24 26 24 27 26   GLUCOSE 142* 153* 151* 151* 147*  BUN 24* 29* 24* 25* 43*  CREATININE 0.98 1.04 1.10 1.13 1.21  CALCIUM 8.3* 8.7* 8.4* 8.6* 8.4*  MG  --   --   --   --  3.5*  PHOS  --   --   --   --  4.7*   GFR: Estimated Creatinine Clearance: 90.1 mL/min (by C-G formula based on SCr of 1.21 mg/dL). Liver Function Tests: Recent Labs  Lab 11/24/19 0252 11/25/19 0309 11/26/19 0247 11/27/19 0320 11/30/19 0307  AST 46* 47* 43* 40 33  ALT 36 46* 44 43 52*  ALKPHOS 78 82 92 105 122  BILITOT 0.7 0.8 0.7 1.2 1.1  PROT 7.0 7.4 6.9 7.1 6.8  ALBUMIN 3.0* 3.2* 3.3* 3.4* 3.5   No results for input(s): LIPASE, AMYLASE in the last 168 hours. No results for input(s): AMMONIA in the last 168 hours. Coagulation Profile: No results for input(s): INR, PROTIME in the last 168 hours. Cardiac Enzymes: No results for input(s): CKTOTAL, CKMB, CKMBINDEX, TROPONINI in the last 168 hours. BNP (last 3 results) No results for input(s): PROBNP in the last 8760 hours. HbA1C: No results for input(s): HGBA1C in the last 72 hours. CBG: Recent Labs  Lab 11/29/19 0816 11/29/19 1216 11/29/19 1544 11/29/19 2215 11/30/19 0739  GLUCAP 131* 190* 157* 135* 148*   Lipid Profile: No results for input(s): CHOL, HDL, LDLCALC, TRIG, CHOLHDL, LDLDIRECT in the last 72 hours. Thyroid Function Tests: No results for input(s): TSH, T4TOTAL, FREET4, T3FREE, THYROIDAB in the last 72 hours. Anemia Panel: No results for input(s): VITAMINB12, FOLATE, FERRITIN, TIBC, IRON, RETICCTPCT in the last 72 hours. Urine analysis: No results found for: COLORURINE, APPEARANCEUR, LABSPEC, PHURINE, GLUCOSEU, HGBUR, BILIRUBINUR, KETONESUR, PROTEINUR, UROBILINOGEN, NITRITE, LEUKOCYTESUR Sepsis Labs: Invalid input(s): PROCALCITONIN, LACTICIDVEN  Recent Results (from the past 240  hour(s))  Respiratory Panel by RT PCR (Flu A&B, Covid) - Nasopharyngeal Swab     Status: Abnormal   Collection Time: 11/30/2019  9:00 PM   Specimen: Nasopharyngeal Swab  Result Value Ref Range Status   SARS Coronavirus 2 by RT PCR POSITIVE (A) NEGATIVE Final    Comment: RESULT CALLED TO, READ BACK BY AND VERIFIED WITH: MAYNARD,C AT 2154 ON 11/25/2019 BY CHERESNOWSKY,T (NOTE) SARS-CoV-2 target nucleic acids are DETECTED.  SARS-CoV-2 RNA is generally detectable in upper respiratory specimens  during the acute phase of infection. Positive results are indicative of the presence of the identified virus, but do not rule out bacterial infection or co-infection with other pathogens not detected by the test. Clinical correlation with patient history and other diagnostic information is necessary to determine patient infection status. The expected result is Negative.  Fact Sheet for Patients:  https://www.moore.com/  Fact Sheet for Healthcare Providers: https://www.young.biz/  This test is not yet approved or cleared by the [place] FDA and  has been authorized for detection and/or diagnosis of SARS-CoV-2 by FDA under an Emergency Use Authorization (EUA).  This EUA will remain in effect (meaning this t est can be used) for the duration of  the COVID-19 declaration under Section 564(b)(1) of the Act, 21 U.S.C. section 360bbb-3(b)(1), unless the authorization is terminated or revoked sooner.      Influenza A by PCR NEGATIVE NEGATIVE Final   Influenza B by PCR NEGATIVE NEGATIVE Final    Comment: (NOTE) The Xpert Xpress SARS-CoV-2/FLU/RSV assay is intended as an aid in  the diagnosis of influenza from Nasopharyngeal swab specimens and  should not be used as a sole basis for treatment. Nasal washings and  aspirates are unacceptable for Xpert Xpress SARS-CoV-2/FLU/RSV  testing.  Fact Sheet for Patients: https://www.moore.com/  Fact  Sheet for Healthcare Providers: https://www.young.biz/  This test is not yet approved or cleared by the [place] FDA and  has been authorized for detection and/or diagnosis of SARS-CoV-2 by  FDA under an Emergency Use Authorization (EUA). This EUA will remain  in effect (meaning this test can be used) for the duration of the  Covid-19 declaration under Section 564(b)(1) of the Act, 21  U.S.C. section 360bbb-3(b)(1), unless the authorization is  terminated or revoked. Performed  at [facility], [address] [place], [place] [address]   Blood Culture (routine x 2)     Status: None   Collection Time: 11/18/2019  9:00 PM   Specimen: BLOOD  Result Value Ref Range Status   Specimen Description   Final    BLOOD RIGHT ANTECUBITAL Performed at [facility], [address] [place], [place] [address]    Special Requests   Final    BOTTLES DRAWN AEROBIC AND ANAEROBIC Blood Culture adequate volume Performed at [facility], [address] [place], [place] [address]    Culture   Final    NO GROWTH 5 DAYS Performed at [facility], [address] [place], [place] [address]    Report Status 11/27/2019 FINAL  Final  Blood Culture (routine x 2)     Status: None   Collection Time: 12/06/2019  9:05 PM   Specimen: BLOOD  Result Value Ref Range Status   Specimen Description   Final    BLOOD LEFT ANTECUBITAL Performed at [facility], [address] [place], [place] [address]    Special Requests   Final    BOTTLES DRAWN AEROBIC AND ANAEROBIC Blood Culture adequate volume Performed at [facility], [address] [place], [place] [address]    Culture   Final    NO GROWTH 5 DAYS Performed at [facility], [address], [place], [place] [address]    Report Status 11/27/2019 FINAL  Final  MRSA PCR Screening     Status: None   Collection Time: 11/22/19 10:11 AM   Specimen: Nasal Mucosa;  Nasopharyngeal  Result Value Ref Range Status   MRSA by PCR NEGATIVE NEGATIVE Final    Comment:        The GeneXpert MRSA Assay (FDA approved for NASAL specimens only), is one component of a comprehensive MRSA colonization surveillance program. It is not intended to diagnose MRSA infection nor to guide or monitor treatment for MRSA infections. Performed at [facility], [address], [place], [place] [address]       Radiology Studies: DG CHEST PORT 1 VIEW  Result Date: 11/30/2019 CLINICAL DATA:  Respiratory failure.  COVID. EXAM: PORTABLE CHEST 1 VIEW COMPARISON:  Four days ago FINDINGS: Artifact from EKG leads. Confluent airspace disease with stable lung volumes. Normal heart size for technique. No visible effusion or air leak. IMPRESSION: Confluent airspace disease with stable inflation. Electronically Signed   By: Marnee Spring M.D.   On: 11/30/2019 05:52     Pamella Pert, MD, PhD [facility]  Between 7 am - 7 pm I am available, please contact me via Amion or Securechat  Between 7 pm - 7 am I am not available, please contact night coverage MD/APP via Amion

## 2019-11-30 NOTE — Progress Notes (Addendum)
NAME:  Stuart Singh, MRN:  [id number], DOB:  11/30/1970, LOS: 8 ADMISSION DATE:  11/14/2019, CONSULTATION DATE:  10/11 REFERRING MD:  Dr. Ronaldo Miyamoto CHIEF COMPLAINT:  SOB, COVID PNA   Brief History   49 y/o M admitted with acute hypoxic respiratory failure with bilateral infiltrates in the setting of COVID PNA. Required heated HFNC, intermittent BiPAP.    Past Medical History  COVID  Significant Hospital Events   10/10 Admit with COVID PNA   10/12 On bipap 10/14 60L flow, 100% FiO2 10/18 70L flow, 100% FiO2 + NRB 10/19 70L flow, 100% fiO2 + NRB / unchanged. Asking for breakfast.   Consults:      Procedures:     Significant Diagnostic Tests:  10/12 ECHO >> LVEF 60-65%, no RWMA, RV systolic function normal  Micro Data:  COVID 10/10 >> positive  Influenza A/B 10/10 >> negative BCx2 10/10 >> negative  MRSA PCR 10/10 >> negative   Antimicrobials/COVID RX  Tociluzimab X 2- 10/11, 10/12  Remdesivir 10/12 >> 10/14 Solumedrol 10/12 >>  Interim history/subjective:  RT reports pt laid prone overnight, did not wear bipap.  Turned supine this am.  Pt asking for breakfast  Afebrile  On heated high flow + NRB Glucose range 140's  I/O 1.7L UOP, no intake recorded   Objective   Blood pressure 119/87, pulse 87, temperature (!) 96.2 F (35.7 C), temperature source Axillary, resp. rate (!) 26, height 5\' 8"  (1.727 m), weight 118 kg, SpO2 (!) 87 %.    FiO2 (%):  [100 %] 100 %   Intake/Output Summary (Last 24 hours) at 11/30/2019 0924 Last data filed at 11/30/2019 0600 Gross per 24 hour  Intake --  Output 1725 ml  Net -1725 ml   Filed Weights   11/14/2019 2148 11/22/19 0946  Weight: 117.9 kg 118 kg    Examination: General: adult male sitting up in bed in NAD, appears tired HEENT: MM pink/dry, no jvd, anicteric  Neuro: AAOx4, speech clear, MAE CV: s1s2 rrr, no m/r/g PULM: non-labored on heated high flow / NRB, lungs bilaterally clear  GI: soft, bsx4 active  Extremities:  warm/dry, no edema  Skin: no rashes or lesions  Resolved Hospital Problem list      Assessment & Plan:   Acute Hypoxic Respiratory Failure in setting of Diffuse Bilateral Infiltrates secondary to COVID PNA Completed remdesivir, recieved Actemra 10/11, 10/12.   -wean O2 for sats >85% -would not intubate unless mental status change or increase in WOB -use bipap to support / splint respiratory muscles & avoid fatigue.  BiPAP QHS -prone positioning as able  -solumedrol 80mg  IV BID, plan for slow taper  -follow intermittent CXR  -mobilize / pulmonary hygiene  -follow inflammatory markers -hold lasix 10/19 with rise in BUN / Sr Cr   Elevated D-Dimer, Rule Out PE D-dimer increased to > 20.  LE duplex and ECHO negative.  -LMWH empiric full dose -unable to get CTA chest due to respiratory status  At Risk AKI  Poor intake + diuresis  -hold scheduled lasix for now, daily reassessment  -Trend BMP / urinary output -Replace electrolytes as indicated -Avoid nephrotoxic agents, ensure adequate renal perfusion  Agitated Delirium  Suspect steroids and hypoxia are playing role here -steroid reduction  -minimize sedating medications  -promote sleep / wake cycle   Best practice:  Diet: Regular, as tolerated  Pain/Anxiety/Delirium protocol (if indicated): n/a  VAP protocol (if indicated): n/a  DVT prophylaxis: Lovenox  GI prophylaxis: n/a Glucose control: per primary  Mobility: as tolerated Code Status: Full Code per patient Family Communication: Per primary Disposition: per primary    CC Time: n/a  Canary Brim, MSN, NP-C, AGACNP-BC [facility] 11/30/2019, 9:24 AM   Please see [email or web address] for pager details.

## 2019-11-30 NOTE — Progress Notes (Signed)
Cross covering ICU physician  Came to eval pt. On NIV and sats in the low 80's. Will encourage self proning at this time.   Appears relatively stable and certainly improved sats with NIV from earlier today.   Cont close monitoring.

## 2019-11-30 NOTE — Progress Notes (Signed)
eLink Physician-Brief Progress Note Patient Name: Stuart Singh DOB: 1970/11/15 MRN: [id number]   Date of Service  11/30/2019  HPI/Events of Note  Hypoxia - Sat = 84% and RR = 24-30 on 70 L/min HHFNC + NRB.   eICU Interventions  Plan: 1. Trial of BiPAP.      Intervention Category Major Interventions: Hypoxemia - evaluation and management  Stuart Singh [name] 11/30/2019, 8:29 PM

## 2019-12-01 DIAGNOSIS — R739 Hyperglycemia, unspecified: Secondary | ICD-10-CM

## 2019-12-01 DIAGNOSIS — E669 Obesity, unspecified: Secondary | ICD-10-CM | POA: Diagnosis not present

## 2019-12-01 DIAGNOSIS — J1282 Pneumonia due to coronavirus disease 2019: Secondary | ICD-10-CM

## 2019-12-01 DIAGNOSIS — T380X5A Adverse effect of glucocorticoids and synthetic analogues, initial encounter: Secondary | ICD-10-CM

## 2019-12-01 DIAGNOSIS — J9601 Acute respiratory failure with hypoxia: Secondary | ICD-10-CM | POA: Diagnosis not present

## 2019-12-01 DIAGNOSIS — G9341 Metabolic encephalopathy: Secondary | ICD-10-CM | POA: Diagnosis not present

## 2019-12-01 DIAGNOSIS — U071 COVID-19: Secondary | ICD-10-CM | POA: Diagnosis not present

## 2019-12-01 DIAGNOSIS — I2699 Other pulmonary embolism without acute cor pulmonale: Secondary | ICD-10-CM

## 2019-12-01 DIAGNOSIS — B370 Candidal stomatitis: Secondary | ICD-10-CM

## 2019-12-01 LAB — COMPREHENSIVE METABOLIC PANEL
ALT: 41 U/L (ref 0–44)
AST: 28 U/L (ref 15–41)
Albumin: 3.5 g/dL (ref 3.5–5.0)
Alkaline Phosphatase: 115 U/L (ref 38–126)
Anion gap: 12 (ref 5–15)
BUN: 42 mg/dL — ABNORMAL HIGH (ref 6–20)
CO2: 25 mmol/L (ref 22–32)
Calcium: 8.2 mg/dL — ABNORMAL LOW (ref 8.9–10.3)
Chloride: 98 mmol/L (ref 98–111)
Creatinine, Ser: 1.13 mg/dL (ref 0.61–1.24)
GFR, Estimated: >60 mL/min (ref >60)
Glucose, Bld: 136 mg/dL — ABNORMAL HIGH (ref 70–99)
Potassium: 4.4 mmol/L (ref 3.5–5.1)
Sodium: 135 mmol/L (ref 135–145)
Total Bilirubin: 1.2 mg/dL (ref 0.3–1.2)
Total Protein: 6.8 g/dL (ref 6.5–8.1)

## 2019-12-01 LAB — CBC
HCT: 46.9 % (ref 39.0–52.0)
Hemoglobin: 16 g/dL (ref 13.0–17.0)
MCH: 31.1 pg (ref 26.0–34.0)
MCHC: 34.1 g/dL (ref 30.0–36.0)
MCV: 91.1 fL (ref 80.0–100.0)
Platelets: 267 10*3/uL (ref 150–400)
RBC: 5.15 MIL/uL (ref 4.22–5.81)
RDW: 13.1 % (ref 11.5–15.5)
WBC: 21.5 10*3/uL — ABNORMAL HIGH (ref 4.0–10.5)
nRBC: 0 % (ref 0.0–0.2)

## 2019-12-01 LAB — GLUCOSE, CAPILLARY
Glucose-Capillary: 118 mg/dL — ABNORMAL HIGH (ref 70–99)
Glucose-Capillary: 133 mg/dL — ABNORMAL HIGH (ref 70–99)
Glucose-Capillary: 135 mg/dL — ABNORMAL HIGH (ref 70–99)
Glucose-Capillary: 144 mg/dL — ABNORMAL HIGH (ref 70–99)

## 2019-12-01 LAB — C-REACTIVE PROTEIN: CRP: 0.6 mg/dL (ref <1.0)

## 2019-12-01 MED ORDER — HYDROCOD POLST-CPM POLST ER 10-8 MG/5ML PO SUER
5.0000 mL | Freq: Two times a day (BID) | ORAL | Status: DC
  Administered 2019-12-01 – 2019-12-05 (×5): 5 mL via ORAL
  Filled 2019-12-01 (×7): qty 5

## 2019-12-01 MED ORDER — IPRATROPIUM-ALBUTEROL 20-100 MCG/ACT IN AERS
1.0000 | INHALATION_SPRAY | Freq: Four times a day (QID) | RESPIRATORY_TRACT | Status: DC
  Administered 2019-12-01 – 2019-12-05 (×5): 1 via RESPIRATORY_TRACT
  Filled 2019-12-01 (×2): qty 4

## 2019-12-01 MED ORDER — METHYLPREDNISOLONE SODIUM SUCC 125 MG IJ SOLR
60.0000 mg | Freq: Two times a day (BID) | INTRAMUSCULAR | Status: DC
  Administered 2019-12-01 – 2019-12-07 (×12): 60 mg via INTRAVENOUS
  Filled 2019-12-01 (×12): qty 2

## 2019-12-01 MED ORDER — ALBUTEROL SULFATE HFA 108 (90 BASE) MCG/ACT IN AERS
2.0000 | INHALATION_SPRAY | RESPIRATORY_TRACT | Status: DC | PRN
  Administered 2019-12-02: 2 via RESPIRATORY_TRACT

## 2019-12-01 NOTE — Progress Notes (Signed)
Patient placed back on NIV at this time due to low O2 and increased WOB. Patient O2 and WOB improved.

## 2019-12-01 NOTE — Progress Notes (Signed)
NAME:  Stuart Singh, MRN:  [id number], DOB:  [birth year], LOS: 9 ADMISSION DATE:  2019/12/02, CONSULTATION DATE:  10/11 REFERRING MD:  Dr. Ronaldo Miyamoto CHIEF COMPLAINT:  SOB, COVID PNA   Brief History   49 y/o M admitted with acute hypoxic respiratory failure with bilateral infiltrates in the setting of COVID PNA. Required heated HFNC, intermittent BiPAP.    Past Medical History  COVID  Significant Hospital Events   10/10 Admit with COVID PNA   10/12 On bipap 10/14 60L flow, 100% FiO2 10/18 70L flow, 100% FiO2 + NRB 10/19 70L flow, 100% fiO2 + NRB / unchanged. Asking for breakfast. Prone overnight. Did not wear BiPAP 10/20 70L flow, 100% fiO2, +NRB  Consults:      Procedures:     Significant Diagnostic Tests:  10/12 ECHO >> LVEF 60-65%, no RWMA, RV systolic function normal  Micro Data:  COVID 10/10 >> positive  Influenza A/B 10/10 >> negative BCx2 10/10 >> negative  MRSA PCR 10/10 >> negative   Antimicrobials/COVID RX  Tociluzimab X 2- 10/11, 10/12  Remdesivir 10/12 >> 10/14 Solumedrol 10/12 >>  Interim history/subjective:  Afebrile  RN reports sats are lower this am on same liter flow.  RN indicates she is going to try soup to get him to eat.   Objective   Blood pressure 124/86, pulse 98, temperature 98.3 F (36.8 C), temperature source Axillary, resp. rate (!) 28, height 5\' 8"  (1.727 m), weight 118 kg, SpO2 (!) 81 %.    Vent Mode: PCV;BIPAP FiO2 (%):  [100 %] 100 % Set Rate:  [12 bmp] 12 bmp PEEP:  [10 cmH20] 10 cmH20   Intake/Output Summary (Last 24 hours) at 12/01/2019 1222 Last data filed at 12/01/2019 0650 Gross per 24 hour  Intake --  Output 1350 ml  Net -1350 ml   Filed Weights   Dec 02, 2019 2148 11/22/19 0946  Weight: 117.9 kg 118 kg    Examination: General: adult male sitting up in bed in NAD   HEENT: MM pink/moist, [place] O2 + NRB, anicteric  Neuro: Awake, alert, MAE, speech clera CV: s1s2 rrr, no m/r/g PULM: non-labored, lungs bilaterally clear GI:  soft, bsx4 active  Extremities: warm/dry, no edema  Skin: no rashes or lesions  Resolved Hospital Problem list      Assessment & Plan:   Acute Hypoxic Respiratory Failure in setting of Diffuse Bilateral Infiltrates secondary to COVID PNA Completed remdesivir, recieved Actemra 10/11, 10/12.   -wean O2 for sats >85% -would not intubate unless mental status change or increase in WOB  -cycle on / off bipap over next 24 hours to splint respiratory muscles / avoid fatigue and QHS -prone positioning as able  -solumedrol 60 mg IV BID  -follow intermittent CXR -trend inflammatory markers > improved -continue to hold lasix 10/20  Elevated D-Dimer, Rule Out PE D-dimer increased to > 20.  LE duplex and ECHO negative.  -LMWH empiric full dose  -unable to get CTA chest due to respiratory status   At Risk AKI  Poor intake + diuresis  -Trend BMP / urinary output -Replace electrolytes as indicated -Avoid nephrotoxic agents, ensure adequate renal perfusion -hold lasix   Agitated Delirium  Improved, suspect steroids and hypoxia are playing role here -reduce steroids as above -minimize sedating medications  -promote sleep 11/20 cycle   Best practice:  Diet: Regular, as tolerated  Pain/Anxiety/Delirium protocol (if indicated): n/a  VAP protocol (if indicated): n/a  DVT prophylaxis: Lovenox  GI prophylaxis: n/a Glucose control: per primary  Mobility: as tolerated Code Status: Full Code per patient.  Confirmed 10/20 that if necessary, he would want intubation.  Family Communication: Called sister [name]") for update 10/20 Disposition: per primary    CC Time: n/a  Canary Brim, MSN, NP-C, AGACNP-BC [facility] 12/01/2019, 12:22 PM   Please see [email or web address] for pager details.

## 2019-12-01 NOTE — TOC Progression Note (Signed)
Transition of Care [facility]) - Progression Note    Patient Details  Name: Stuart Singh MRN: [id number] Date of Birth: 1970/03/06  Transition of Care [facility]) CM/SW Contact  Golda Acre, RN Phone Number: 12/01/2019, 9:16 AM  Clinical Narrative:    49 year old man admitted 11/18/2019 with hypoxemic respiratory failure, infiltrates on chest imaging found to be COVID 19 positive.  Slow decline over last many days. WOB slightly increased, Sats 70s after eating breakfast on 100% HFNC and NRB. Suspect will progress to intubation if that is within his GOC (Currently it is). Not optimistic he will improve on ventilator but is an option. Will be honest with communication regarding prognosis.  Synopsis of assessment and plan:  Acute hypoxemic respiratory failure due to ARDS from COVID 19 pneumonia: s/p remdesivir and toci. Continues IV steroids with high dose solumedrol. Sats 70s this AM after eating a little breakfast. Will address GOC, if full bore ahead suspect will be intubated by end of day. DNR/DNI would be very reasonable. --Supportive care --wean solumedrol  Delirium: ICU and steroids. --decreasing solumedrol with taper  Elevated D-Dimer: TTE and LE dopplers ok. Related to raging inflammation, PE not ruled out. --CT PE ordered, not stable for transport/scan --Continue therapeutic lovenox  102021 hfnrb mask at 70l/min, iv solu medrol Following for progression plan is to return to home once condition improves. Expected Discharge Plan: Home w Home Health Services Barriers to Discharge: Barriers Unresolved (comment)  Expected Discharge Plan and Services Expected Discharge Plan: Home w Home Health Services   Discharge Planning Services: CM Consult   Living arrangements for the past 2 months: Single Family Home                                       Social Determinants of Health (SDOH) Interventions    Readmission Risk Interventions No flowsheet data found.

## 2019-12-01 NOTE — Progress Notes (Signed)
Pt sats in the 60's, placed on BIPAP Sats currently 80 and slowly climbing.  Will continue to monitor

## 2019-12-01 NOTE — Progress Notes (Addendum)
PROGRESS NOTE    Stuart Singh  HBZ:[id number] DOB: 09/10/1970 DOA: 12/12/2019 PCP: Patient, No Pcp Per    Brief Narrative:  Stuart Singh was admitted with a working diagnosis of acute hypoxic respiratory failure due to SARS COVID-19 viral pneumonia.  49 year old male with no significant past medical history who presented with acute onset of dyspnea, progressive symptoms, worse with exertion. Symptomatic for about 3 days. Attended to a funeral about 2 weeks before admission to the hospital. On his initial physical examination blood pressure 120/81, heart rate 79, respiratory rate 34, temperature 97.8, oxygen saturation 53 to 88%.  His lungs had decreased breath sounds bilaterally, scattered rhonchi and increased work of breathing, heart S1-S2, present, rhythmic, abdomen soft and nontender, no lower extremity edema.  Chest radiograph with bilateral interstitial infiltrates, right upper lobe, right lower lobe, and left lower lobe.  Patient with severe respiratory failure, admitted to the stepdown unit, has been placed on noninvasive mechanical ventilation intermittently, encourage prone position.  Patient has been medically treated with remdesivir #5, systemic corticosteroids and Tocilizumab #2 (10/11, 10/12). D-dimer greater than 20, placed on therapeutic anticoagulation.  10/16 and 10.17 at night had confusion/ panic attacks.    Assessment & Plan:   Principal Problem:   Pneumonia due to COVID-19 virus Active Problems:   Acute respiratory failure with hypoxia (HCC)   Steroid-induced hyperglycemia   Obesity, Class II, BMI 35-39.9   Acute metabolic encephalopathy   Oral thrush   Pulmonary embolism (HCC)   1. Acute hypoxic respiratory failure due to SARS COVID 19 viral pneumonia. (patient fully vaccinated for Covid 19)   RR: 31  Pulse oxymetry: 77% to 87% Fi02: 100% Bipap at night with heated HFNC 100% / 70 L/min   COVID-19 Labs  Recent Labs    11/30/19 0307 12/01/19 0301    DDIMER 3.66*  --   CRP <0.5 0.6    Lab Results  Component Value Date   SARSCOV2NAA POSITIVE (A) 11/29/2019    Patient continue to be on maximal support with heated high flow nasal cannula, plus non invasive mechanical ventilation at night. He has been intermittently prone during the day. Reports dyspnea to be stable. Poor oral intake.   Chest film from 10/19 with diffuse bilateral interstitial infiltrates.   Continue with bronchodilator therapy albuterol and ipratropium, antitussive agents and air way clearing techniques as tolerated.  High dose systemic corticosteroids.  Full dose anticoagulation with enoxaparin.   2. Suspected pulmonary embolism. Patient with a very high d dimer, high pre-test probability for PE. Unable to get CT chest due to respiratory failure, high risk for transportation. [place] lower extremities 10/12, negative for DVT.   Continue full dose anticoagulation with enoxaparin.   3. Steroid induced hyperglycemia. Fasting glucose this am 136, patient with poor oral intake.  Continue insulin therapy with pre-meal and sliding scale.  Continue with linagliptin.   4. Metabolic encephalopathy. Today with no confusion or agitation. Continue with as needed haldol.   5. Reactive leukocytosis/ oral thrush. Wbc up to 21.5 today, no signs of bacterial infection, continue to hold on antibiotic therapy and follow cell count in am.  Continue with nystatin.   6. Obesity class 2. Calculated BMI is 39.   Patient continue to be at high risk for worsening respiratory failure. Critically ill with imminent worsening respiratory failure.   Critical care time 45 minutes.   Status is: Inpatient  Remains inpatient appropriate because:IV treatments appropriate due to intensity of illness or inability to take PO  Dispo: The patient is from: Home              Anticipated d/c is to: Home              Anticipated d/c date is: > 3 days              Patient currently is not medically  stable to d/c.   DVT prophylaxis: Enoxaparin   Code Status:   full  Family Communication:  I spoke over the phone with the patient's  Sister about patient's  condition, plan of care, prognosis and all questions were addressed.      Consultants:   Pulmonary    Subjective: Patient very weak and deconditioned, no nausea or vomiting poor oral intake. Bipap at night and as tolerated prone.   Objective: Vitals:   12/01/19 0313 12/01/19 0400 12/01/19 0500 12/01/19 0600  BP:  134/87 135/70 130/72  Pulse: 85 87 80 84  Resp: (!) 25 (!) 28 (!) 26 (!) 26  Temp:  (!) 97.3 F (36.3 C)    TempSrc:  Axillary    SpO2: (!) 89% (!) 88% (!) 89% (!) 87%  Weight:      Height:        Intake/Output Summary (Last 24 hours) at 12/01/2019 0833 Last data filed at 12/01/2019 0650 Gross per 24 hour  Intake --  Output 1350 ml  Net -1350 ml   Filed Weights   12/05/2019 2148 11/22/19 0946  Weight: 117.9 kg 118 kg    Examination:   General: positive dyspnea at rest, deconditioned and ill looking appearing  Neurology: Awake and alert, non focal  E ENT: mild pallor, no icterus, oral mucosa moist Cardiovascular: No JVD. S1-S2 present, rhythmic, no gallops, rubs, or murmurs. No lower extremity edema. Pulmonary: positive breath sounds bilaterally, no wheezing or rhonchi.  Gastrointestinal. Abdomen soft and non tender Skin. No rashes Musculoskeletal: no joint deformities     Data Reviewed: I have personally reviewed following labs and imaging studies  CBC: Recent Labs  Lab 11/25/19 0309 11/26/19 0247 11/27/19 0320 12/01/19 0301  WBC 10.0 15.8* 17.2* 21.5*  NEUTROABS 8.7* 14.1* 15.5*  --   HGB 15.1 14.7 15.3 16.0  HCT 44.6 43.6 45.9 46.9  MCV 91.6 91.2 91.6 91.1  PLT 220 232 259 267   Basic Metabolic Panel: Recent Labs  Lab 11/25/19 0309 11/26/19 0247 11/27/19 0320 11/30/19 0307 12/01/19 0301  NA 146* 139 137 136 135  K 4.5 4.4 5.0 4.4 4.4  CL 106 102 98 97* 98  CO2 26 24 27  26 25   GLUCOSE 153* 151* 151* 147* 136*  BUN 29* 24* 25* 43* 42*  CREATININE 1.04 1.10 1.13 1.21 1.13  CALCIUM 8.7* 8.4* 8.6* 8.4* 8.2*  MG  --   --   --  3.5*  --   PHOS  --   --   --  4.7*  --    GFR: Estimated Creatinine Clearance: 96.5 mL/min (by C-G formula based on SCr of 1.13 mg/dL). Liver Function Tests: Recent Labs  Lab 11/25/19 0309 11/26/19 0247 11/27/19 0320 11/30/19 0307 12/01/19 0301  AST 47* 43* 40 33 28  ALT 46* 44 43 52* 41  ALKPHOS 82 92 105 122 115  BILITOT 0.8 0.7 1.2 1.1 1.2  PROT 7.4 6.9 7.1 6.8 6.8  ALBUMIN 3.2* 3.3* 3.4* 3.5 3.5   No results for input(s): LIPASE, AMYLASE in the last 168 hours. No results for input(s): AMMONIA in the last  168 hours. Coagulation Profile: No results for input(s): INR, PROTIME in the last 168 hours. Cardiac Enzymes: No results for input(s): CKTOTAL, CKMB, CKMBINDEX, TROPONINI in the last 168 hours. BNP (last 3 results) No results for input(s): PROBNP in the last 8760 hours. HbA1C: No results for input(s): HGBA1C in the last 72 hours. CBG: Recent Labs  Lab 11/30/19 0739 11/30/19 1248 11/30/19 1545 11/30/19 2101 12/01/19 0751  GLUCAP 148* 218* 208* 119* 118*   Lipid Profile: No results for input(s): CHOL, HDL, LDLCALC, TRIG, CHOLHDL, LDLDIRECT in the last 72 hours. Thyroid Function Tests: No results for input(s): TSH, T4TOTAL, FREET4, T3FREE, THYROIDAB in the last 72 hours. Anemia Panel: No results for input(s): VITAMINB12, FOLATE, FERRITIN, TIBC, IRON, RETICCTPCT in the last 72 hours.    Radiology Studies: I have reviewed all of the imaging during this hospital visit personally     Scheduled Meds: . albuterol  2-6 puff Inhalation Q6H  . vitamin C  500 mg Oral Daily  . chlorhexidine  15 mL Mouth Rinse BID  . Chlorhexidine Gluconate Cloth  6 each Topical Daily  . docusate sodium  100 mg Oral Daily  . enoxaparin (LOVENOX) injection  120 mg Subcutaneous Q12H  . insulin aspart  0-15 Units Subcutaneous  TID WC  . insulin aspart  0-5 Units Subcutaneous QHS  . insulin aspart  3 Units Subcutaneous TID WC  . linagliptin  5 mg Oral Daily  . mouth rinse  15 mL Mouth Rinse q12n4p  . methylPREDNISolone (SOLU-MEDROL) injection  80 mg Intravenous Q12H  . nystatin  5 mL Oral QID  . polyethylene glycol  17 g Oral Daily  . senna  1 tablet Oral Daily  . zinc sulfate  220 mg Oral Daily   Continuous Infusions: . sodium chloride Stopped (11/23/19 0855)     LOS: 9 days        Sinaya Minogue Annett Gula, MD

## 2019-12-02 ENCOUNTER — Inpatient Hospital Stay (HOSPITAL_COMMUNITY): Payer: BC Managed Care – PPO

## 2019-12-02 DIAGNOSIS — U071 COVID-19: Secondary | ICD-10-CM | POA: Diagnosis not present

## 2019-12-02 DIAGNOSIS — J1282 Pneumonia due to coronavirus disease 2019: Secondary | ICD-10-CM | POA: Diagnosis not present

## 2019-12-02 LAB — C-REACTIVE PROTEIN: CRP: 0.6 mg/dL (ref <1.0)

## 2019-12-02 LAB — COMPREHENSIVE METABOLIC PANEL
ALT: 37 U/L (ref 0–44)
AST: 33 U/L (ref 15–41)
Albumin: 3.5 g/dL (ref 3.5–5.0)
Alkaline Phosphatase: 115 U/L (ref 38–126)
Anion gap: 12 (ref 5–15)
BUN: 36 mg/dL — ABNORMAL HIGH (ref 6–20)
CO2: 25 mmol/L (ref 22–32)
Calcium: 8.4 mg/dL — ABNORMAL LOW (ref 8.9–10.3)
Chloride: 98 mmol/L (ref 98–111)
Creatinine, Ser: 1.02 mg/dL (ref 0.61–1.24)
GFR, Estimated: >60 mL/min (ref >60)
Glucose, Bld: 136 mg/dL — ABNORMAL HIGH (ref 70–99)
Potassium: 4.9 mmol/L (ref 3.5–5.1)
Sodium: 135 mmol/L (ref 135–145)
Total Bilirubin: 1.2 mg/dL (ref 0.3–1.2)
Total Protein: 6.9 g/dL (ref 6.5–8.1)

## 2019-12-02 LAB — D-DIMER, QUANTITATIVE: D-Dimer, Quant: 3.28 ug/mL-FEU — ABNORMAL HIGH (ref 0.00–0.50)

## 2019-12-02 LAB — FERRITIN: Ferritin: 537 ng/mL — ABNORMAL HIGH (ref 24–336)

## 2019-12-02 LAB — GLUCOSE, CAPILLARY
Glucose-Capillary: 113 mg/dL — ABNORMAL HIGH (ref 70–99)
Glucose-Capillary: 110 mg/dL — ABNORMAL HIGH (ref 70–99)
Glucose-Capillary: 110 mg/dL — ABNORMAL HIGH (ref 70–99)
Glucose-Capillary: 114 mg/dL — ABNORMAL HIGH (ref 70–99)

## 2019-12-02 MED ORDER — FUROSEMIDE 10 MG/ML IJ SOLN
40.0000 mg | Freq: Once | INTRAMUSCULAR | Status: AC
  Administered 2019-12-02: 40 mg via INTRAVENOUS
  Filled 2019-12-02: qty 4

## 2019-12-02 MED ORDER — LORAZEPAM 2 MG/ML IJ SOLN
0.5000 mg | INTRAMUSCULAR | Status: DC | PRN
  Administered 2019-12-02 – 2019-12-03 (×3): 0.5 mg via INTRAVENOUS
  Filled 2019-12-02 (×3): qty 1

## 2019-12-02 NOTE — Progress Notes (Signed)
Pt remains on BIPAP at this time. 

## 2019-12-02 NOTE — Progress Notes (Addendum)
PROGRESS NOTE    Stuart Singh  QJJ:[id number] DOB: 13-Sep-1970 DOA: 12/21/19 PCP: Stuart Singh    Brief Narrative:  Stuart Singh was admitted with a working diagnosis of acute hypoxic respiratory failure due to SARS COVID-19 viral pneumonia.  49 year old male with no significant past medical history who presented with acute onset of dyspnea, progressive symptoms, worse with exertion. Symptomatic for about 3 days. Attended to a funeral about 2 weeks before admission to the hospital. On his initial physical examination blood pressure 120/81, heart rate 79, respiratory rate 34, temperature 97.8, oxygen saturation 53 to 88%.  His lungs had decreased breath sounds bilaterally, scattered rhonchi and increased work of breathing, heart S1-S2, present, rhythmic, abdomen soft and nontender, no lower extremity edema.  Chest radiograph with bilateral interstitial infiltrates, right upper lobe, right lower lobe, and left lower lobe.  Patient with severe respiratory failure, admitted to the stepdown unit, has been placed on noninvasive mechanical ventilation intermittently, encourage prone position.  Patient has been medically treated with remdesivir #5, systemic corticosteroids and Tocilizumab #2 (10/11, 10/12). D-dimer greater than 20, placed on therapeutic anticoagulation.  10/16 and 10.17 at night had confusion/ panic attacks.   Patient continue to use non invasive mechanical ventilation intermittently for worsening hypoxemia and increase work of breathing.   This am patient not able to wean to heated high flow nasal cannula due to severe oxygen desaturation with altered mental status.    Assessment & Plan:   Principal Problem:   Pneumonia due to COVID-19 virus Active Problems:   Acute respiratory failure with hypoxia (HCC)   Steroid-induced hyperglycemia   Obesity, Class II, BMI 35-39.9   Acute metabolic encephalopathy   Oral thrush   Pulmonary embolism (HCC)   1. Acute  hypoxic respiratory failure due to SARS COVID 19 viral pneumonia. (patient fully vaccinated for Covid 19) sp remdesivir #5/5, Tocilizumab #2 (10/11 and 10/12).   RR: 33 Pulse oxymetry: 88% -91% Fi02:NIV PC; peep 10, pressure 20, Fi02 100% Vt 700 to 800 cc  COVID-19 Labs  Recent Labs    11/30/19 0307 12/01/19 0301 12/02/19 0240  DDIMER 3.66*  --  3.28*  FERRITIN  --   --  537*  CRP <0.5 0.6 0.6    Lab Results  Component Value Date   SARSCOV2NAA POSITIVE (A) 12/21/2019     He has been on non invasive mechanical ventilation since 18:10 yesterday, continuously, this am unable to wean to heated high flow nasal cannula, rapid oxygen desaturation down to 30% with acute altered mental status change, unresponsive.   On NIV PC with peep of 10 and pressure of 20, generating Vt 700 to 800, oxygen saturation low 80"s. Awake and alert.  Chest film consistent with ARDS, will repeat film this am.   Will discuss with pulmonary possible elective invasive mechanical ventilation to allow protective lung ventilation Singh ARDS net protocol.   On bronchodilator therapy albuterol and ipratropium and antitussive agents. limited oral intake or oral medications due to full face mask ventilation.   Continue with high dose systemic corticosteroids and full anticoagulation with enoxaparin.   2. Suspected pulmonary embolism. Patient with a very high d dimer, high pre-test probability for PE. Unable to get CT chest due to respiratory failure, high risk for transportation. [place] lower extremities 10/12, negative for DVT.   On full anticoagulation with enoxaparin, he has complained epistaxis.    3. Steroid induced hyperglycemia. Continue insulin therapy for glucose control, this am fasting was 136. Now NPO  Hold on linagliptin.   4. Metabolic encephalopathy. Patient not comfortable with tight interface, will continue as needed haldol and will add lorazepam as needed.   5. Reactive leukocytosis/ oral  thrush. Follow cell count in am, continue to hold on antibiotic therapy for now.  Continue with nystatin.   6. Obesity class 2.  BMI is 39.   Patient continue to be at high risk for worsening respiratory failure. Patient critically ill imminent risk for rapid deterioration,   Critical care time 45 minutes.   Status is: Inpatient  Remains inpatient appropriate because:IV treatments appropriate due to intensity of illness or inability to take PO   Dispo: The patient is from: Home              Anticipated d/c is to: Home              Anticipated d/c date is: > 3 days              Patient currently is not medically stable to d/c.   DVT prophylaxis: Enoxaparin   Code Status:   full  Family Communication:   I spoke over the phone with the patient's sister about patient's  condition, plan of care, prognosis and all questions were addressed.   Consultants:   Pulmonary   *    Subjective: Patient this am with rapid oxygen desaturation while trying to get to HFNC, positive unresponsiveness. Complains of tight face mask and epistaxis.   Objective: Vitals:   12/02/19 0500 12/02/19 0600 12/02/19 0700 12/02/19 0800  BP: (!) 132/100 110/82 (!) 121/96   Pulse: 86 87 85   Resp: (!) 26 (!) 24 (!) 23   Temp:    (!) 97.3 F (36.3 C)  TempSrc:    Axillary  SpO2: 90% 90% (!) 88%   Weight:      Height:        Intake/Output Summary (Last 24 hours) at 12/02/2019 0830 Last data filed at 12/01/2019 2200 Gross Singh 24 hour  Intake --  Output 900 ml  Net -900 ml   Filed Weights   2019/12/14 2148 11/22/19 0946  Weight: 117.9 kg 118 kg    Examination:   General: dyspnea at rest, full face mask. Neurology: Awake and alert, non foca. Improved mentation with 02 80's  E ENT: no pallor, no icterus, oral mucosa moist. Full face mask with no signs of epistaxis Cardiovascular: No JVD. S1-S2 present, rhythmic, no gallops, rubs, or murmurs. No lower extremity edema. Pulmonary: positive breath  sounds bilaterally, with no wheezing, rhonchi or rales. Gastrointestinal. Abdomen soft and non tender Skin. No rashes Musculoskeletal: no joint deformities     Data Reviewed: I have personally reviewed following labs and imaging studies  CBC: Recent Labs  Lab 11/26/19 0247 11/27/19 0320 12/01/19 0301  WBC 15.8* 17.2* 21.5*  NEUTROABS 14.1* 15.5*  --   HGB 14.7 15.3 16.0  HCT 43.6 45.9 46.9  MCV 91.2 91.6 91.1  PLT 232 259 267   Basic Metabolic Panel: Recent Labs  Lab 11/26/19 0247 11/27/19 0320 11/30/19 0307 12/01/19 0301 12/02/19 0240  NA 139 137 136 135 135  K 4.4 5.0 4.4 4.4 4.9  CL 102 98 97* 98 98  CO2 24 27 26 25 25   GLUCOSE 151* 151* 147* 136* 136*  BUN 24* 25* 43* 42* 36*  CREATININE 1.10 1.13 1.21 1.13 1.02  CALCIUM 8.4* 8.6* 8.4* 8.2* 8.4*  MG  --   --  3.5*  --   --  PHOS  --   --  4.7*  --   --    GFR: Estimated Creatinine Clearance: 106.9 mL/min (by C-G formula based on SCr of 1.02 mg/dL). Liver Function Tests: Recent Labs  Lab 11/26/19 0247 11/27/19 0320 11/30/19 0307 12/01/19 0301 12/02/19 0240  AST 43* 40 33 28 33  ALT 44 43 52* 41 37  ALKPHOS 92 105 122 115 115  BILITOT 0.7 1.2 1.1 1.2 1.2  PROT 6.9 7.1 6.8 6.8 6.9  ALBUMIN 3.3* 3.4* 3.5 3.5 3.5   No results for input(s): LIPASE, AMYLASE in the last 168 hours. No results for input(s): AMMONIA in the last 168 hours. Coagulation Profile: No results for input(s): INR, PROTIME in the last 168 hours. Cardiac Enzymes: No results for input(s): CKTOTAL, CKMB, CKMBINDEX, TROPONINI in the last 168 hours. BNP (last 3 results) No results for input(s): PROBNP in the last 8760 hours. HbA1C: No results for input(s): HGBA1C in the last 72 hours. CBG: Recent Labs  Lab 12/01/19 0751 12/01/19 1126 12/01/19 1624 12/01/19 2027 12/02/19 0813  GLUCAP 118* 133* 135* 144* 113*   Lipid Profile: No results for input(s): CHOL, HDL, LDLCALC, TRIG, CHOLHDL, LDLDIRECT in the last 72 hours. Thyroid  Function Tests: No results for input(s): TSH, T4TOTAL, FREET4, T3FREE, THYROIDAB in the last 72 hours. Anemia Panel: Recent Labs    12/02/19 0240  FERRITIN 537*      Radiology Studies: I have reviewed all of the imaging during this hospital visit personally     Scheduled Meds: . vitamin C  500 mg Oral Daily  . chlorhexidine  15 mL Mouth Rinse BID  . Chlorhexidine Gluconate Cloth  6 each Topical Daily  . chlorpheniramine-HYDROcodone  5 mL Oral Q12H  . docusate sodium  100 mg Oral Daily  . enoxaparin (LOVENOX) injection  120 mg Subcutaneous Q12H  . insulin aspart  0-15 Units Subcutaneous TID WC  . insulin aspart  0-5 Units Subcutaneous QHS  . insulin aspart  3 Units Subcutaneous TID WC  . Ipratropium-Albuterol  1 puff Inhalation Q6H  . linagliptin  5 mg Oral Daily  . mouth rinse  15 mL Mouth Rinse q12n4p  . methylPREDNISolone (SOLU-MEDROL) injection  60 mg Intravenous Q12H  . nystatin  5 mL Oral QID  . polyethylene glycol  17 g Oral Daily  . senna  1 tablet Oral Daily  . zinc sulfate  220 mg Oral Daily   Continuous Infusions: . sodium chloride Stopped (11/23/19 0855)     LOS: 10 days        Kataleia Quaranta Annett Gula, MD

## 2019-12-02 NOTE — Progress Notes (Signed)
RT took pt off BIPAP/NIV and placed on heated high flow. Rt had to put pt back on BIPAP due to low sats. MD and RN at bedside.

## 2019-12-02 NOTE — Progress Notes (Signed)
NAME:  Stuart Singh, MRN:  [id number], DOB:  [birth year], LOS: 10 ADMISSION DATE:  15-Dec-2019, CONSULTATION DATE:  10/11 REFERRING MD:  Dr. Ronaldo Miyamoto CHIEF COMPLAINT:  SOB, COVID PNA   Brief History   49 y/o M admitted with acute hypoxic respiratory failure with bilateral infiltrates in the setting of COVID PNA. Required heated HFNC, intermittent BiPAP.    Past Medical History  COVID  Significant Hospital Events   10/10 Admit with COVID PNA   10/12 On bipap 10/14 60L flow, 100% FiO2 10/18 70L flow, 100% FiO2 + NRB 10/19 70L flow, 100% fiO2 + NRB / unchanged. Asking for breakfast. Prone overnight. Did not wear BiPAP 10/20 70L flow, 100% fiO2, +NRB  Consults:      Procedures:     Significant Diagnostic Tests:  10/12 ECHO >> LVEF 60-65%, no RWMA, RV systolic function normal  Micro Data:  COVID 10/10 >> positive  Influenza A/B 10/10 >> negative BCx2 10/10 >> negative  MRSA PCR 10/10 >> negative   Antimicrobials/COVID RX  Tociluzimab X 2- 10/11, 10/12  Remdesivir 10/12 >> 10/14 Solumedrol 10/12 >>  Interim history/subjective:  Afebrile  RT reports pt had episode of desaturation off bipap to the 30's while attempting to change to heated high flow, had to go back on BiPAP Pt reports he feels ok, sats improved to the low 90's on BiPAP  Objective   Blood pressure 128/78, pulse 92, temperature (!) 97.3 F (36.3 C), temperature source Axillary, resp. rate (!) 33, height 5\' 8"  (1.727 m), weight 118 kg, SpO2 91 %.    Vent Mode: PCV;BIPAP FiO2 (%):  [100 %] 100 % Set Rate:  [12 bmp] 12 bmp PEEP:  [10 cmH20] 10 cmH20   Intake/Output Summary (Last 24 hours) at 12/02/2019 1239 Last data filed at 12/02/2019 1115 Gross per 24 hour  Intake --  Output 1350 ml  Net -1350 ml   Filed Weights   12/15/2019 2148 11/22/19 0946  Weight: 117.9 kg 118 kg    Examination: General: adult male lying in bed on bipap HEENT: MM pink/dry, bipap mask in place, pupils reactive  Neuro: Awake,  alert, oriented.  Speech clear, MAE.  Reports he is anxious with the mask in place  CV: s1s2 RRR, no m/r/g PULM: tachypnea but not labored, lungs bilaterally diminished but clear  GI: soft, bsx4 active  Extremities: warm/dry, no edema  Skin: no rashes or lesions  Resolved Hospital Problem list      Assessment & Plan:   Acute Hypoxic Respiratory Failure in setting of Diffuse Bilateral Infiltrates secondary to COVID PNA Completed remdesivir, recieved Actemra 10/11, 10/12.   -wean O2 for sats >85% -would not intubate unless mental status change or increase in WOB  -cycle on / off bipap  -prone positioning as able  -solumedrol 60mg  IV BID  -follow CXR  -trend inflammatory markers > improved  -lasix 40 mg IV x1   Elevated D-Dimer, Rule Out PE D-dimer increased to > 20.  LE duplex and ECHO negative.  -LMWH empiric full dose as unable to get CTA chest due to respiratory status   At Risk AKI  Poor intake + diuresis  -lasix as abive -Trend BMP / urinary output -Replace electrolytes as indicated -Avoid nephrotoxic agents, ensure adequate renal perfusion  Agitated Delirium  Improved, suspect steroids and hypoxia are playing role here -steroids as above  -minimize sedating medications  -promote sleep / wake cycle   Best practice:  Diet: Regular, as tolerated  Pain/Anxiety/Delirium protocol (if  indicated): n/a  VAP protocol (if indicated): n/a  DVT prophylaxis: Lovenox  GI prophylaxis: n/a Glucose control: per primary  Mobility: as tolerated Code Status: Full Code.  Confirmed 10/20 that if necessary, he would want intubation.  Family Communication: Per primary.  Called sister [name]") for update 10/20 Disposition: per primary    CC Time: n/a  Canary Brim, MSN, NP-C, AGACNP-BC [facility] 12/02/2019, 12:39 PM   Please see [email or web address] for pager details.

## 2019-12-03 ENCOUNTER — Inpatient Hospital Stay (HOSPITAL_COMMUNITY): Payer: BC Managed Care – PPO

## 2019-12-03 DIAGNOSIS — L89811 Pressure ulcer of head, stage 1: Secondary | ICD-10-CM

## 2019-12-03 DIAGNOSIS — L8990 Pressure ulcer of unspecified site, unspecified stage: Secondary | ICD-10-CM | POA: Insufficient documentation

## 2019-12-03 DIAGNOSIS — J1282 Pneumonia due to coronavirus disease 2019: Secondary | ICD-10-CM | POA: Diagnosis not present

## 2019-12-03 DIAGNOSIS — U071 COVID-19: Secondary | ICD-10-CM | POA: Diagnosis not present

## 2019-12-03 LAB — CBC WITH DIFFERENTIAL/PLATELET
Abs Immature Granulocytes: 0.43 10*3/uL — ABNORMAL HIGH (ref 0.00–0.07)
Basophils Absolute: 0.1 10*3/uL (ref 0.0–0.1)
Basophils Relative: 0 %
Eosinophils Absolute: 0 10*3/uL (ref 0.0–0.5)
Eosinophils Relative: 0 %
HCT: 47.7 % (ref 39.0–52.0)
Hemoglobin: 15.7 g/dL (ref 13.0–17.0)
Immature Granulocytes: 2 %
Lymphocytes Relative: 3 %
Lymphs Abs: 0.6 10*3/uL — ABNORMAL LOW (ref 0.7–4.0)
MCH: 31 pg (ref 26.0–34.0)
MCHC: 32.9 g/dL (ref 30.0–36.0)
MCV: 94.3 fL (ref 80.0–100.0)
Monocytes Absolute: 0.3 10*3/uL (ref 0.1–1.0)
Monocytes Relative: 1 %
Neutro Abs: 21.7 10*3/uL — ABNORMAL HIGH (ref 1.7–7.7)
Neutrophils Relative %: 94 %
Platelets: 223 10*3/uL (ref 150–400)
RBC: 5.06 MIL/uL (ref 4.22–5.81)
RDW: 13.5 % (ref 11.5–15.5)
WBC: 23.1 10*3/uL — ABNORMAL HIGH (ref 4.0–10.5)
nRBC: 0 % (ref 0.0–0.2)

## 2019-12-03 LAB — COMPREHENSIVE METABOLIC PANEL
ALT: 32 U/L (ref 0–44)
AST: 29 U/L (ref 15–41)
Albumin: 3.6 g/dL (ref 3.5–5.0)
Alkaline Phosphatase: 107 U/L (ref 38–126)
Anion gap: 13 (ref 5–15)
BUN: 43 mg/dL — ABNORMAL HIGH (ref 6–20)
CO2: 26 mmol/L (ref 22–32)
Calcium: 8.6 mg/dL — ABNORMAL LOW (ref 8.9–10.3)
Chloride: 99 mmol/L (ref 98–111)
Creatinine, Ser: 1.1 mg/dL (ref 0.61–1.24)
GFR, Estimated: >60 mL/min (ref >60)
Glucose, Bld: 136 mg/dL — ABNORMAL HIGH (ref 70–99)
Potassium: 4.8 mmol/L (ref 3.5–5.1)
Sodium: 138 mmol/L (ref 135–145)
Total Bilirubin: 1 mg/dL (ref 0.3–1.2)
Total Protein: 6.8 g/dL (ref 6.5–8.1)

## 2019-12-03 LAB — GLUCOSE, CAPILLARY
Glucose-Capillary: 120 mg/dL — ABNORMAL HIGH (ref 70–99)
Glucose-Capillary: 156 mg/dL — ABNORMAL HIGH (ref 70–99)
Glucose-Capillary: 134 mg/dL — ABNORMAL HIGH (ref 70–99)
Glucose-Capillary: 120 mg/dL — ABNORMAL HIGH (ref 70–99)

## 2019-12-03 LAB — D-DIMER, QUANTITATIVE: D-Dimer, Quant: 3.03 ug/mL-FEU — ABNORMAL HIGH (ref 0.00–0.50)

## 2019-12-03 LAB — FERRITIN: Ferritin: 500 ng/mL — ABNORMAL HIGH (ref 24–336)

## 2019-12-03 LAB — C-REACTIVE PROTEIN: CRP: <0.5 mg/dL (ref <1.0)

## 2019-12-03 MED ORDER — FENTANYL CITRATE (PF) 100 MCG/2ML IJ SOLN
INTRAMUSCULAR | Status: AC
  Filled 2019-12-03: qty 2

## 2019-12-03 MED ORDER — MIDAZOLAM HCL 2 MG/2ML IJ SOLN
INTRAMUSCULAR | Status: AC
  Filled 2019-12-03: qty 4

## 2019-12-03 MED ORDER — PROPOFOL 500 MG/50ML IV EMUL
INTRAVENOUS | Status: AC
  Filled 2019-12-03: qty 50

## 2019-12-03 MED ORDER — STERILE WATER FOR INJECTION IJ SOLN
INTRAMUSCULAR | Status: AC
  Filled 2019-12-03: qty 10

## 2019-12-03 MED ORDER — SUCCINYLCHOLINE CHLORIDE 200 MG/10ML IV SOSY
PREFILLED_SYRINGE | INTRAVENOUS | Status: AC
  Filled 2019-12-03: qty 10

## 2019-12-03 MED ORDER — VECURONIUM BROMIDE 10 MG IV SOLR
INTRAVENOUS | Status: AC
  Filled 2019-12-03: qty 10

## 2019-12-03 MED ORDER — ROCURONIUM BROMIDE 10 MG/ML (PF) SYRINGE
PREFILLED_SYRINGE | INTRAVENOUS | Status: AC
  Filled 2019-12-03: qty 10

## 2019-12-03 MED ORDER — LIDOCAINE HCL (CARDIAC) PF 100 MG/5ML IV SOSY
PREFILLED_SYRINGE | INTRAVENOUS | Status: AC
  Filled 2019-12-03: qty 5

## 2019-12-03 MED ORDER — ETOMIDATE 2 MG/ML IV SOLN
INTRAVENOUS | Status: AC
  Filled 2019-12-03: qty 20

## 2019-12-03 NOTE — Progress Notes (Addendum)
PROGRESS NOTE    Stuart Singh  DDU:[id number] DOB: November 27, 1970 DOA: 11/26/2019 PCP: Patient, No Pcp Per    Brief Narrative:  Mr. Stuart Singh was admitted with a working diagnosis of acute hypoxic respiratory failure due to SARS COVID-19 viral pneumonia.  49 year old male with no significant past medical history who presented with acute onset of dyspnea, progressive symptoms, worse with exertion.Symptomatic for about 3 days. Attended to a funeral about 2 weeks before admission to the hospital.On his initial physical examination blood pressure 120/81, heart rate 79, respiratory rate 34, temperature 97.8, oxygen saturation 53 to88%. His lungs had decreased breath sounds bilaterally, scattered rhonchi and increased work of breathing, heart S1-S2, present, rhythmic, abdomen soft and nontender, no lower extremity edema.  Chest radiograph with bilateral interstitial infiltrates, right upper lobe, right lower lobe, and left lower lobe.  Patient with severe respiratory failure, admitted to the stepdown unit, has been placed on noninvasive mechanical ventilation intermittently, encourage prone position.  Patient has been medically treated with remdesivir#5, systemic corticosteroids and Tocilizumab#2 (10/11, 10/12). D-dimer greater than 20, placed on therapeutic anticoagulation.  10/16 and 10.17 at night had confusion/ panic attacks.  Patient placed on non invasive mechanical ventilation intermittently for worsening hypoxemia and increase work of breathing.   Currently he is not able to wean to heated high flow nasal cannula due to rapid and severe oxygen desaturation with altered mental status.   Assessment & Plan:   Principal Problem:   Pneumonia due to COVID-19 virus Active Problems:   Acute respiratory failure with hypoxia (HCC)   Steroid-induced hyperglycemia   Obesity, Class II, BMI 35-39.9   Acute metabolic encephalopathy   Oral thrush   Pulmonary embolism (HCC)   Pressure  injury of skin   1. Acute hypoxic respiratory failure due to SARS COVID 19 viral pneumonia/ ARDS. (patient fully vaccinated for Covid 19)sp remdesivir #5/5, Tocilizumab #2 (10/11 and 10/12).   RR: 24  Pulse oxymetry: 92 to 93%  Fi02: 100%/ NIV PC Peep 6, Pressure 12.   COVID-19 Labs  Recent Labs    12/01/19 0301 12/02/19 0240 12/03/19 0455  DDIMER  --  3.28* 3.03*  FERRITIN  --  537* 500*  CRP 0.6 0.6 <0.5    Lab Results  Component Value Date   SARSCOV2NAA POSITIVE (A) 11/12/2019    Patient has been on non invasive mechanical ventilation for the last 36 hours, he has received furosemide with urine output 1900 ml over last 24 H. Follow up chest film with bilateral infiltrates at bases, interstitial and alveolar, more dense on the right.   Patient is awake and alert. No worsening increased work of breathing.   Attempt heated high flow nasal cannula this am, keep oxygen saturation more than 80%.   Continue with bronchodilator therapy albuterol/ ipratropium and antitussive agents, systemic  corticosteroids (methylprednsiolone 60 mg IV q12h). Anticoagulation with enoxaparin.   Limited mobility due to severe hypoxemia.   2. Suspected pulmonary embolism. Patient with a very high d dimer, high pre-test probability for PE. Unable to get CT chest due to respiratory failure, high risk for transportation. [place] lower extremities 10/12, negative for DVT.   Tolerating well anticoagulation with enoxaparin, no further epistaxis.  3. Steroid induced hyperglycemia.  Fasting was 136 this am. Currently with NPO. Continue insulin sliding scale.   4. Metabolic encephalopathy. Non further use of haldol, he required 2 doses of lorazepam yesterday, total of 1 mg.   5. Reactive leukocytosis/ oral thrush. Persistent leukocytosis, 23 today. No clinical signs  of bacterial infection, continue to hold on antibiotic therapy. Continue with nystatin.   6. Obesity class 2.  BMI is 39.  7.  Stage 1 pressure ulcer/ bridge of the nose, not present on admission. Trail of full face mask this am.   Patient continue to be at high risk for worsening respiratory failure/ critically ill with imminent worsening/ continue respiratory support to prevent worsening.   Critical care time 45 minutes.   Status is: Inpatient  Remains inpatient appropriate because:IV treatments appropriate due to intensity of illness or inability to take PO   Dispo: The patient is from: Home              Anticipated d/c is to: Home              Anticipated d/c date is: > 3 days              Patient currently is not medically stable to d/c.  DVT prophylaxis: Enoxaparin   Code Status:   full  Family Communication:   I spoke over the phone with the patient's sister about patient's  condition, plan of care, prognosis and all questions were addressed.       Skin Documentation: Pressure Injury 12/02/19 Nose Upper Stage 1 -  Intact skin with non-blanchable redness of a localized area usually over a bony prominence. (Active)  12/02/19 2000  Location: Nose  Location Orientation: Upper  Staging: Stage 1 -  Intact skin with non-blanchable redness of a localized area usually over a bony prominence.  Wound Description (Comments):   Present on Admission:      Consultants:   Pulmonary     Subjective: Patient continue on full face interface, dyspnea stable, no chest pain, no nausea or vomiting, he has been npo for the last 36 H.   Objective: Vitals:   12/03/19 0200 12/03/19 0400 12/03/19 0500 12/03/19 0600  BP: (!) 125/92 (!) 125/95  (!) 139/93  Pulse: 87 82 81 81  Resp: (!) 23 (!) 25 (!) 24   Temp:      TempSrc:      SpO2: 92% 93% 92% 93%  Weight:      Height:        Intake/Output Summary (Last 24 hours) at 12/03/2019 0823 Last data filed at 12/03/2019 0600 Gross per 24 hour  Intake 20 ml  Output 1900 ml  Net -1880 ml   Filed Weights   11/28/2019 2148 11/22/19 0946  Weight: 117.9 kg 118 kg     Examination:   General: deconditioned  Neurology: Awake and alert, non focal  E ENT: no pallor, no icterus, full face mask.  Cardiovascular: No JVD. S1-S2 present, rhythmic, no gallops, rubs, or murmurs. No lower extremity edema. Pulmonary: positive breath sounds bilaterally,  no wheezing, or rhonchi, scattered rales. Gastrointestinal. Abdomen soft and non tender Skin. No rashes Musculoskeletal: no joint deformities     Data Reviewed: I have personally reviewed following labs and imaging studies  CBC: Recent Labs  Lab 11/27/19 0320 12/01/19 0301 12/03/19 0455  WBC 17.2* 21.5* 23.1*  NEUTROABS 15.5*  --  21.7*  HGB 15.3 16.0 15.7  HCT 45.9 46.9 47.7  MCV 91.6 91.1 94.3  PLT 259 267 223   Basic Metabolic Panel: Recent Labs  Lab 11/27/19 0320 11/30/19 0307 12/01/19 0301 12/02/19 0240 12/03/19 0455  NA 137 136 135 135 138  K 5.0 4.4 4.4 4.9 4.8  CL 98 97* 98 98 99  CO2 27 26 25  25  26  GLUCOSE 151* 147* 136* 136* 136*  BUN 25* 43* 42* 36* 43*  CREATININE 1.13 1.21 1.13 1.02 1.10  CALCIUM 8.6* 8.4* 8.2* 8.4* 8.6*  MG  --  3.5*  --   --   --   PHOS  --  4.7*  --   --   --    GFR: Estimated Creatinine Clearance: 99.1 mL/min (by C-G formula based on SCr of 1.1 mg/dL). Liver Function Tests: Recent Labs  Lab 11/27/19 0320 11/30/19 0307 12/01/19 0301 12/02/19 0240 12/03/19 0455  AST 40 33 28 33 29  ALT 43 52* 41 37 32  ALKPHOS 105 122 115 115 107  BILITOT 1.2 1.1 1.2 1.2 1.0  PROT 7.1 6.8 6.8 6.9 6.8  ALBUMIN 3.4* 3.5 3.5 3.5 3.6   No results for input(s): LIPASE, AMYLASE in the last 168 hours. No results for input(s): AMMONIA in the last 168 hours. Coagulation Profile: No results for input(s): INR, PROTIME in the last 168 hours. Cardiac Enzymes: No results for input(s): CKTOTAL, CKMB, CKMBINDEX, TROPONINI in the last 168 hours. BNP (last 3 results) No results for input(s): PROBNP in the last 8760 hours. HbA1C: No results for input(s): HGBA1C in the  last 72 hours. CBG: Recent Labs  Lab 12/02/19 0813 12/02/19 1111 12/02/19 1657 12/02/19 2140 12/03/19 0747  GLUCAP 113* 110* 110* 114* 120*   Lipid Profile: No results for input(s): CHOL, HDL, LDLCALC, TRIG, CHOLHDL, LDLDIRECT in the last 72 hours. Thyroid Function Tests: No results for input(s): TSH, T4TOTAL, FREET4, T3FREE, THYROIDAB in the last 72 hours. Anemia Panel: Recent Labs    12/02/19 0240 12/03/19 0455  FERRITIN 537* 500*      Radiology Studies: I have reviewed all of the imaging during this hospital visit personally     Scheduled Meds: . vitamin C  500 mg Oral Daily  . chlorhexidine  15 mL Mouth Rinse BID  . Chlorhexidine Gluconate Cloth  6 each Topical Daily  . chlorpheniramine-HYDROcodone  5 mL Oral Q12H  . docusate sodium  100 mg Oral Daily  . enoxaparin (LOVENOX) injection  120 mg Subcutaneous Q12H  . insulin aspart  0-15 Units Subcutaneous TID WC  . insulin aspart  0-5 Units Subcutaneous QHS  . insulin aspart  3 Units Subcutaneous TID WC  . Ipratropium-Albuterol  1 puff Inhalation Q6H  . mouth rinse  15 mL Mouth Rinse q12n4p  . methylPREDNISolone (SOLU-MEDROL) injection  60 mg Intravenous Q12H  . nystatin  5 mL Oral QID  . polyethylene glycol  17 g Oral Daily  . senna  1 tablet Oral Daily  . zinc sulfate  220 mg Oral Daily   Continuous Infusions: . sodium chloride Stopped (11/23/19 0855)     LOS: 11 days        Stuart Boike Annett Gula, MD

## 2019-12-03 NOTE — Progress Notes (Addendum)
NAME:  Stuart Singh, MRN:  [id number], DOB:  Jun 24, 1970, LOS: 11 ADMISSION DATE:  19-Dec-2019, CONSULTATION DATE:  10/11 REFERRING MD:  Dr. Ronaldo Miyamoto CHIEF COMPLAINT:  SOB, COVID PNA   Brief History   49 y/o M admitted with acute hypoxic respiratory failure with bilateral infiltrates in the setting of COVID PNA. Required heated HFNC, intermittent BiPAP.    Past Medical History  COVID  Significant Hospital Events   10/10 Admit with COVID PNA   10/12 On bipap 10/14 60L flow, 100% FiO2 10/18 70L flow, 100% FiO2 + NRB 10/19 70L flow, 100% fiO2 + NRB / unchanged. Asking for breakfast. Prone overnight. Did not wear BiPAP 10/20 70L flow, 100% fiO2, +NRB 10/21 On BiPAP, desaturation to 30's with change to heated high flow / recovered  10/22 Remains on BiPAP 100%, 5 PEEP  Consults:      Procedures:     Significant Diagnostic Tests:  10/12 ECHO >> LVEF 60-65%, no RWMA, RV systolic function normal  Micro Data:  COVID 10/10 >> positive  Influenza A/B 10/10 >> negative BCx2 10/10 >> negative  MRSA PCR 10/10 >> negative   Antimicrobials/COVID RX  Tociluzimab X 2- 10/11, 10/12  Remdesivir 10/12 >> 10/14 Solumedrol 10/12 >>   Interim history/subjective:  RN reports pt remains on bipap Afebrile  Pt asking for water / food  Desaturated when placed on HHFNC this am to 70's I/O 1.9L UOP, -1.8L in last 24 hours  Objective   Blood pressure 137/90, pulse 86, temperature 98 F (36.7 C), temperature source Axillary, resp. rate (!) 25, height 5\' 8"  (1.727 m), weight 118 kg, SpO2 (!) 88 %.    Vent Mode: PCV;BIPAP FiO2 (%):  [100 %] 100 % Set Rate:  [12 bmp] 12 bmp PEEP:  [6 cmH20-10 cmH20] 6 cmH20   Intake/Output Summary (Last 24 hours) at 12/03/2019 0944 Last data filed at 12/03/2019 0600 Gross per 24 hour  Intake 20 ml  Output 1900 ml  Net -1880 ml   Filed Weights   Dec 19, 2019 2148 11/22/19 0946  Weight: 117.9 kg 118 kg    Examination: General: adult male lying in bed HEENT:  MM pink/dry, bipap mask in place, pupils equal / reactive  Neuro: Awakens to voice, communicates appropriately, MAE CV: s1s2 rrr, no m/r/g PULM: non-labored on BiPAP, lungs bilaterally diminished but clear  GI: soft, bsx4 active  Extremities: warm/dry, no edema  Skin: no rashes or lesions  Resolved Hospital Problem list      Assessment & Plan:   Acute Hypoxic Respiratory Failure in setting of Diffuse Bilateral Infiltrates secondary to COVID PNA Completed remdesivir, recieved Actemra 10/11, 10/12.   -wean O2 for sats >85% -would not intubate unless mental status change or increase in WOB > currently both are stable / unchanged but he is high risk for intubation  -cycle on / off bipap -prone positioning as able  -solumedrol 60 mg IV BID  -follow intermittent CXR  -trend inflammatory markers > improving -hold further lasix   Elevated D-Dimer, Rule Out PE D-dimer increased to > 20.  LE duplex and ECHO negative.  -empiric full dose LMWH  -unable to get CTA due to respiratory status    At Risk AKI  Poor intake + diuresis  -hold lasix 10/22 with limited PO intake, negative balance and slight rise in BUN  -Trend BMP / urinary output -Replace electrolytes as indicated -Avoid nephrotoxic agents, ensure adequate renal perfusion  Agitated Delirium  Improved, suspect steroids and hypoxia are playing role  here -wean steroids as able -minimize sedating medications  -promote sleep/wake cycle   Best practice:  Diet: Regular, as tolerated  Pain/Anxiety/Delirium protocol (if indicated): n/a  VAP protocol (if indicated): n/a  DVT prophylaxis: Lovenox  GI prophylaxis: n/a Glucose control: per primary  Mobility: as tolerated Code Status: Full Code.  Confirmed 10/20 that if necessary, he would want intubation.  Family Communication: Per primary.  Called sister [name]") for update 10/20 Disposition: per primary    CC Time:  N/A  Canary Brim, MSN, NP-C, AGACNP-BC  Pulmonary &  Critical Care 12/03/2019, 9:44 AM   Please see [email or web address] for pager details.

## 2019-12-03 NOTE — Progress Notes (Signed)
RT attempted to place pt on HHFNC/NRB at 70L and 100%. Pt was unable to tolerate with increased WOB and saturations dropping down to 61%. Pt placed back on NIV/BiPAP, tolerating this well with saturations at 88-89%. Vitals are stable at this time, RT will continue to monitor.

## 2019-12-03 NOTE — Progress Notes (Signed)
ANTICOAGULATION CONSULT NOTE   Pharmacy Consult for lovenox Indication: r/o pulmonary embolus  Allergies  Allergen Reactions  . Codeine Rash  . Anacin-3 [Acetaminophen]     Tylenol 3    Patient Measurements: Height: 5\' 8"  (172.7 cm) Weight: 118 kg (260 lb 2.3 oz) IBW/kg (Calculated) : 68.4 Heparin Dosing Weight:   Vital Signs: Temp: 98 F (36.7 C) (10/22 0800) Temp Source: Axillary (10/22 0800) BP: 134/67 (10/22 1000) Pulse Rate: 99 (10/22 1000)  Labs: Recent Labs    12/01/19 0301 12/02/19 0240 12/03/19 0455  HGB 16.0  --  15.7  HCT 46.9  --  47.7  PLT 267  --  223  CREATININE 1.13 1.02 1.10    Estimated Creatinine Clearance: 99.1 mL/min (by C-G formula based on SCr of 1.1 mg/dL).   Assessment: Patient is a 49 y.o M presented to the ED on 10/10 with c/o SOB and subsequently tested positive for COVID-19.  He was started on LMWH prophylaxis dose on admission.  Ddimer increased to >20 on 10/13.  Pharmacy consulted to increase LMWH to full dose on 10/13 for r/o PE.  - 10/12 LE doppler: neg for DVT  Today, 12/03/2019: - cbc stable - no bleeding documented - SCr 1.1 - D dimer down to 3.03 - unable to perform chest CTA at this time d/t pt's clinical status  Goal of Therapy:  Anti-Xa level 0.6-1 units/ml 4hrs after LMWH dose given Monitor platelets by anticoagulation protocol: Yes   Plan:  - continue lovenox full treatment dose = 120 mg SQ q12h - monitor for s/sx bleeding - f/u chest CTA results if/when able to transport patient down for procedure F/u CBC & SCr  12/05/2019, Pharm.D 12/03/2019 11:07 AM

## 2019-12-04 LAB — COMPREHENSIVE METABOLIC PANEL
ALT: 32 U/L (ref 0–44)
AST: 28 U/L (ref 15–41)
Albumin: 3.4 g/dL — ABNORMAL LOW (ref 3.5–5.0)
Alkaline Phosphatase: 99 U/L (ref 38–126)
Anion gap: 14 (ref 5–15)
BUN: 42 mg/dL — ABNORMAL HIGH (ref 6–20)
CO2: 27 mmol/L (ref 22–32)
Calcium: 8.7 mg/dL — ABNORMAL LOW (ref 8.9–10.3)
Chloride: 95 mmol/L — ABNORMAL LOW (ref 98–111)
Creatinine, Ser: 1.05 mg/dL (ref 0.61–1.24)
GFR, Estimated: >60 mL/min (ref >60)
Glucose, Bld: 130 mg/dL — ABNORMAL HIGH (ref 70–99)
Potassium: 5 mmol/L (ref 3.5–5.1)
Sodium: 136 mmol/L (ref 135–145)
Total Bilirubin: 1.2 mg/dL (ref 0.3–1.2)
Total Protein: 6.7 g/dL (ref 6.5–8.1)

## 2019-12-04 LAB — D-DIMER, QUANTITATIVE: D-Dimer, Quant: 2.61 ug/mL-FEU — ABNORMAL HIGH (ref 0.00–0.50)

## 2019-12-04 LAB — FERRITIN: Ferritin: 525 ng/mL — ABNORMAL HIGH (ref 24–336)

## 2019-12-04 LAB — GLUCOSE, CAPILLARY
Glucose-Capillary: 133 mg/dL — ABNORMAL HIGH (ref 70–99)
Glucose-Capillary: 143 mg/dL — ABNORMAL HIGH (ref 70–99)
Glucose-Capillary: 134 mg/dL — ABNORMAL HIGH (ref 70–99)
Glucose-Capillary: 118 mg/dL — ABNORMAL HIGH (ref 70–99)

## 2019-12-04 LAB — CBC
HCT: 46.5 % (ref 39.0–52.0)
Hemoglobin: 15.4 g/dL (ref 13.0–17.0)
MCH: 31.3 pg (ref 26.0–34.0)
MCHC: 33.1 g/dL (ref 30.0–36.0)
MCV: 94.5 fL (ref 80.0–100.0)
Platelets: 229 10*3/uL (ref 150–400)
RBC: 4.92 MIL/uL (ref 4.22–5.81)
RDW: 13.4 % (ref 11.5–15.5)
WBC: 23.8 10*3/uL — ABNORMAL HIGH (ref 4.0–10.5)
nRBC: 0 % (ref 0.0–0.2)

## 2019-12-04 LAB — C-REACTIVE PROTEIN: CRP: <0.5 mg/dL (ref <1.0)

## 2019-12-04 NOTE — Progress Notes (Addendum)
PROGRESS NOTE    Clearance Chenault  UKG:[id number] DOB: [birth year] DOA: 11/27/2019 PCP: Patient, No Pcp Per    Brief Narrative:  Mr. Beevers was admitted with a working diagnosis of acute hypoxic respiratory failure due to SARS COVID-19 viral pneumonia.  49 year old male with no significant past medical history who presented with acute onset of dyspnea, progressive symptoms, worse with exertion.Symptomatic for about 3 days. Attended to a funeral about 2 weeks before admission to the hospital.On his initial physical examination blood pressure 120/81, heart rate 79, respiratory rate 34, temperature 97.8, oxygen saturation 53 to88%. His lungs had decreased breath sounds bilaterally, scattered rhonchi and increased work of breathing, heart S1-S2, present, rhythmic, abdomen soft and nontender, no lower extremity edema.  Chest radiograph with bilateral interstitial infiltrates, right upper lobe, right lower lobe, and left lower lobe.  Patient with severe respiratory failure, admitted to the stepdown unit, has been placed on noninvasive mechanical ventilation intermittently, encourage prone position.  Patient has been medically treated with remdesivir#5, systemic corticosteroids and Tocilizumab#2 (10/11, 10/12). D-dimer greater than 20, placed on therapeutic anticoagulation.  10/16 and 10.17 at night had confusion/ panic attacks.  Patient placed on non invasive mechanical ventilation intermittently for worsening hypoxemia and increase work of breathing.  Currently he is not able to wean to heated high flow nasal cannula due to rapid and severe oxygen desaturation with altered mental status.   Furosemide on 10/21 for non cardiogenic pulmonary edema.    Assessment & Plan:   Principal Problem:   Pneumonia due to COVID-19 virus Active Problems:   Acute respiratory failure with hypoxia (HCC)   Steroid-induced hyperglycemia   Obesity, Class II, BMI 35-39.9   Acute metabolic  encephalopathy   Oral thrush   Pulmonary embolism (HCC)   Pressure injury of skin   1. Acute hypoxic respiratory failure due to SARS COVID 19 viral pneumonia/ ARDS. (patient fully vaccinated for Covid 19)sp remdesivir #5/5, Tocilizumab #2 (10/11 and 10/12).  RR: 26  Pulse oxymetry: 88%  Fi02: NIV PSV Peep 6, Pressure 12, Fi02100% + 15 L HFNC/ Vt 800 to 900 cc, Leak 65  Signs of air trapping on flow waveform).   COVID-19 Labs  Recent Labs    12/02/19 0240 12/03/19 0455 12/04/19 0237  DDIMER 3.28* 3.03* 2.61*  FERRITIN 537* 500* 525*  CRP 0.6 <0.5 <0.5    Lab Results  Component Value Date   SARSCOV2NAA POSITIVE (A) 11/15/2019    Ventilator settings have been changed to PSV, still generating large tidal volumes 800 to 900 cc. He has been on full face mask now for the last 60 hours continuously.  Patient continue to communicate with gestures and voice, continue to be orientated. No agitation.   Attempt transition on heated high flow nasal cannula today.   Unable to do bronchodilator therapy or antitussive agents due to full face mask. Continue with high dose of systemic corticosteroids (methylprednsiolone 60 mg IV q12h). Antithrombotic therapy with enoxaparin.   Difficult to prone or get out of bed due to full face interface.    2. Suspected pulmonary embolism. Patient with a very high d dimer, high pre-test probability for PE. Unable to get CT chest due to respiratory failure, high risk for transportation. [place] lower extremities 10/12, negative for DVT.   No sings of bleeding or recurrent epistaxis, continue anticoagulation with enoxaparin.   3. Steroid induced hyperglycemia.Glucose continue to be well controlled, with insulin sliding scale.  Fasting glucose this am is 130. Patient has been NPO due  to NIV, full face mask interface.   4. Metabolic encephalopathy.He has been calm and cooperative, no further agitation.  He used only one dose of lorazepam 0,5 mg  yesterday morning at 9 am.   5. Reactive leukocytosis/ oral thrush. Stable leukocytosis, likely reactive, continue to hold on antibiotic therapy for now.  On nystatin (but not able to get due to NPO status full face mask)   6. Obesity class 2. BMI is 39.  7. Stage 1 pressure ulcer/ bridge of the nose, not present on admission. Continue local skin care.  Attempt for HFNC this am.  Patient critically ill with imminent risk for deterioration, continue with aggressive pulmonary support to prevent worsening.   Critical care time 45 minutes.   Status is: Inpatient  Remains inpatient appropriate because:IV treatments appropriate due to intensity of illness or inability to take PO   Dispo: The patient is from: Home              Anticipated d/c is to: Home              Anticipated d/c date is: > 3 days              Patient currently is not medically stable to d/c.   DVT prophylaxis: Enoxaparin   Code Status:    full Family Communication:   I spoke over the phone with the patient's sister about patient's  condition, plan of care, prognosis and all questions were addressed.    Nutrition Status:           Skin Documentation: Pressure Injury 12/02/19 Nose Upper Stage 1 -  Intact skin with non-blanchable redness of a localized area usually over a bony prominence. (Active)  12/02/19 2000  Location: Nose  Location Orientation: Upper  Staging: Stage 1 -  Intact skin with non-blanchable redness of a localized area usually over a bony prominence.  Wound Description (Comments):   Present on Admission:      Consultants:   Pulmonary     Subjective: Patient is sleeping this am, but easy to arouse, able to communicate with gestures, dyspnea reported to be stable, positive hunger.   Objective: Vitals:   12/04/19 0400 12/04/19 0500 12/04/19 0600 12/04/19 0825  BP: (!) 130/112 132/84 101/72 (!) 139/92  Pulse: 86 84 81 85  Resp: (!) 21 (!) 23 (!) 24 (!) 26  Temp: (!) 97.5 F  (36.4 C)   97.6 F (36.4 C)  TempSrc: Axillary   Axillary  SpO2: 91% 91% 92% (!) 88%  Weight:      Height:        Intake/Output Summary (Last 24 hours) at 12/04/2019 0838 Last data filed at 12/04/2019 0500 Gross per 24 hour  Intake 20 ml  Output 650 ml  Net -630 ml   Filed Weights   December 13, 2019 2148 11/22/19 0946  Weight: 117.9 kg 118 kg    Examination:   General: deconditioned and ill looking appearing  Neurology: easy to arouse, follows commands and responds simple questions. E ENT: full face mask. Dressing on bridge of the nose.  Cardiovascular: No JVD. S1-S2 present, rhythmic, no gallops, rubs, or murmurs. No lower extremity edema. Pulmonary: positive breath sounds bilaterally, scattered rales bilaterally, mre at dependent zones.  Gastrointestinal. Abdomen soft and non tender Skin. No rashes Musculoskeletal: no joint deformities     Data Reviewed: I have personally reviewed following labs and imaging studies  CBC: Recent Labs  Lab 12/01/19 0301 12/03/19 0455 12/04/19 0237  WBC  21.5* 23.1* 23.8*  NEUTROABS  --  21.7*  --   HGB 16.0 15.7 15.4  HCT 46.9 47.7 46.5  MCV 91.1 94.3 94.5  PLT 267 223 229   Basic Metabolic Panel: Recent Labs  Lab 11/30/19 0307 12/01/19 0301 12/02/19 0240 12/03/19 0455 12/04/19 0237  NA 136 135 135 138 136  K 4.4 4.4 4.9 4.8 5.0  CL 97* 98 98 99 95*  CO2 26 25 25 26 27   GLUCOSE 147* 136* 136* 136* 130*  BUN 43* 42* 36* 43* 42*  CREATININE 1.21 1.13 1.02 1.10 1.05  CALCIUM 8.4* 8.2* 8.4* 8.6* 8.7*  MG 3.5*  --   --   --   --   PHOS 4.7*  --   --   --   --    GFR: Estimated Creatinine Clearance: 103.8 mL/min (by C-G formula based on SCr of 1.05 mg/dL). Liver Function Tests: Recent Labs  Lab 11/30/19 0307 12/01/19 0301 12/02/19 0240 12/03/19 0455 12/04/19 0237  AST 33 28 33 29 28  ALT 52* 41 37 32 32  ALKPHOS 122 115 115 107 99  BILITOT 1.1 1.2 1.2 1.0 1.2  PROT 6.8 6.8 6.9 6.8 6.7  ALBUMIN 3.5 3.5 3.5 3.6 3.4*     No results for input(s): LIPASE, AMYLASE in the last 168 hours. No results for input(s): AMMONIA in the last 168 hours. Coagulation Profile: No results for input(s): INR, PROTIME in the last 168 hours. Cardiac Enzymes: No results for input(s): CKTOTAL, CKMB, CKMBINDEX, TROPONINI in the last 168 hours. BNP (last 3 results) No results for input(s): PROBNP in the last 8760 hours. HbA1C: No results for input(s): HGBA1C in the last 72 hours. CBG: Recent Labs  Lab 12/03/19 0747 12/03/19 1151 12/03/19 1611 12/03/19 2136 12/04/19 0740  GLUCAP 120* 156* 134* 120* 133*   Lipid Profile: No results for input(s): CHOL, HDL, LDLCALC, TRIG, CHOLHDL, LDLDIRECT in the last 72 hours. Thyroid Function Tests: No results for input(s): TSH, T4TOTAL, FREET4, T3FREE, THYROIDAB in the last 72 hours. Anemia Panel: Recent Labs    12/03/19 0455 12/04/19 0237  FERRITIN 500* 525*      Radiology Studies: I have reviewed all of the imaging during this hospital visit personally     Scheduled Meds: . vitamin C  500 mg Oral Daily  . chlorhexidine  15 mL Mouth Rinse BID  . Chlorhexidine Gluconate Cloth  6 each Topical Daily  . chlorpheniramine-HYDROcodone  5 mL Oral Q12H  . docusate sodium  100 mg Oral Daily  . enoxaparin (LOVENOX) injection  120 mg Subcutaneous Q12H  . insulin aspart  0-15 Units Subcutaneous TID WC  . insulin aspart  0-5 Units Subcutaneous QHS  . Ipratropium-Albuterol  1 puff Inhalation Q6H  . mouth rinse  15 mL Mouth Rinse q12n4p  . methylPREDNISolone (SOLU-MEDROL) injection  60 mg Intravenous Q12H  . nystatin  5 mL Oral QID  . polyethylene glycol  17 g Oral Daily  . senna  1 tablet Oral Daily  . zinc sulfate  220 mg Oral Daily   Continuous Infusions: . sodium chloride Stopped (11/23/19 0855)     LOS: 12 days        Sunjai Levandoski 01/23/20, MD

## 2019-12-04 NOTE — Progress Notes (Signed)
RT NOTE: RT took patient off bipap and placed patient on HHFNC/NRB per MD to give patient a break from the bipap. Patient seems to be tolerating well at this time. Sat's are currently 84% and patient says his breathing feels okay at this time. Sat goal 70% or greater per MD. RN aware. RT will continue to monitor.

## 2019-12-04 NOTE — Progress Notes (Signed)
Placed PT back on BiPAP due to increased work of breathing and low Sp02. PT states now that he is on BiPAP he is receiving enough air on inhalation and can exhale fully. RT increased BiPAP setting (on Flowsheet) to achieve Sp02 => 85% as per MD order while PT is on BiPAP. RN aware.

## 2019-12-05 DIAGNOSIS — L89891 Pressure ulcer of other site, stage 1: Secondary | ICD-10-CM

## 2019-12-05 LAB — COMPREHENSIVE METABOLIC PANEL
ALT: 32 U/L (ref 0–44)
AST: 29 U/L (ref 15–41)
Albumin: 3.6 g/dL (ref 3.5–5.0)
Alkaline Phosphatase: 89 U/L (ref 38–126)
Anion gap: 12 (ref 5–15)
BUN: 45 mg/dL — ABNORMAL HIGH (ref 6–20)
CO2: 28 mmol/L (ref 22–32)
Calcium: 8.6 mg/dL — ABNORMAL LOW (ref 8.9–10.3)
Chloride: 98 mmol/L (ref 98–111)
Creatinine, Ser: 1.13 mg/dL (ref 0.61–1.24)
GFR, Estimated: >60 mL/min (ref >60)
Glucose, Bld: 135 mg/dL — ABNORMAL HIGH (ref 70–99)
Potassium: 5.2 mmol/L — ABNORMAL HIGH (ref 3.5–5.1)
Sodium: 138 mmol/L (ref 135–145)
Total Bilirubin: 1.5 mg/dL — ABNORMAL HIGH (ref 0.3–1.2)
Total Protein: 6.7 g/dL (ref 6.5–8.1)

## 2019-12-05 LAB — GLUCOSE, CAPILLARY
Glucose-Capillary: 140 mg/dL — ABNORMAL HIGH (ref 70–99)
Glucose-Capillary: 146 mg/dL — ABNORMAL HIGH (ref 70–99)
Glucose-Capillary: 154 mg/dL — ABNORMAL HIGH (ref 70–99)
Glucose-Capillary: 154 mg/dL — ABNORMAL HIGH (ref 70–99)
Glucose-Capillary: 162 mg/dL — ABNORMAL HIGH (ref 70–99)
Glucose-Capillary: 163 mg/dL — ABNORMAL HIGH (ref 70–99)

## 2019-12-05 LAB — D-DIMER, QUANTITATIVE: D-Dimer, Quant: 2.25 ug/mL-FEU — ABNORMAL HIGH (ref 0.00–0.50)

## 2019-12-05 LAB — FERRITIN: Ferritin: 614 ng/mL — ABNORMAL HIGH (ref 24–336)

## 2019-12-05 LAB — C-REACTIVE PROTEIN: CRP: <0.5 mg/dL (ref <1.0)

## 2019-12-05 MED ORDER — SODIUM ZIRCONIUM CYCLOSILICATE 10 G PO PACK
10.0000 g | PACK | Freq: Three times a day (TID) | ORAL | Status: AC
  Administered 2019-12-05: 10 g via ORAL
  Filled 2019-12-05: qty 1

## 2019-12-05 NOTE — Progress Notes (Signed)
PHARMACY NOTE -  Lovenox  Pharmacy has been assisting with dosing of Lovenox for suspected PE.  Dosage remains stable at 1 mg/kg SQ q12 hr and need for further dosage adjustment appears unlikely at present given stable SCr  Pharmacy will sign off, following peripherally for labs and dose adjustments  Bernadene Person, PharmD, BCPS [phone] 12/05/2019, 1:24 PM

## 2019-12-05 NOTE — Progress Notes (Addendum)
PROGRESS NOTE    Stuart Singh  RAQ:[id number] DOB: [birth year] DOA: 12/08/2019 PCP: Patient, No Pcp Per    Brief Narrative:  Stuart Singh was admitted with a working diagnosis of acute hypoxic respiratory failure due to SARS COVID-19 viral pneumonia. Prolonged hospital stay complicated with suspected pulmonary embolism and persistent severe hypoxemic respiratory failure.   49 year old male with no significant past medical history who presented with acute onset of dyspnea, progressive symptoms, worse with exertion.Symptomatic for about 3 days. Attended to a funeral about 2 weeks before admission to the hospital.On his initial physical examination blood pressure 120/81, heart rate 79, respiratory rate 34, temperature 97.8, oxygen saturation 53 to88%. His lungs had decreased breath sounds bilaterally, scattered rhonchi and increased work of breathing, heart S1-S2, present, rhythmic, abdomen soft and nontender, no lower extremity edema.  Chest radiograph with bilateral interstitial infiltrates, right upper lobe, right lower lobe, and left lower lobe.  Patient with severe respiratory failure, admitted to the stepdown unit, has been placed on noninvasive mechanical ventilation intermittently, encourage prone position.  Patient has been medically treated with remdesivir#5, systemic corticosteroids and Tocilizumab#2 (10/11, 10/12). D-dimer greater than 20, placed on therapeutic anticoagulation.  10/16 and 10.17 at night had confusion/ panic attacks.  Patientplaced onnon invasive mechanical ventilation intermittently for worsening hypoxemia and increase work of breathing.  Furosemide on 10/21 for non cardiogenic pulmonary edema.   He had been not able wean to heated high flow nasal cannula due to rapid andsevere oxygen desaturation with altered mental status, until 10/23.   Intermittent epistaxis, improve oxygenation with prone position.    Assessment & Plan:   Principal  Problem:   Pneumonia due to COVID-19 virus Active Problems:   Acute respiratory failure with hypoxia (HCC)   Steroid-induced hyperglycemia   Obesity, Class II, BMI 35-39.9   Acute metabolic encephalopathy   Oral thrush   Pulmonary embolism (HCC)   Pressure injury of skin    1. Acute hypoxic respiratory failure due to SARS COVID 19 viral pneumonia/ ARDS. (patient fully vaccinated for Covid 19)sp remdesivir #5/5, Tocilizumab #2 (10/11 and 10/12).  RR: 26  Pulse oxymetry: 93%  Fi02: NIV PSV, peep 8, pressure 10, 100% Fi02, generating Vt 800 cc    About 8 H of HFNC 45 L/ min with 100% Fi02 plus NRBM, 02 sat 88 to 90%   COVID-19 Labs  Recent Labs    12/03/19 0455 12/04/19 0237 12/05/19 0247  DDIMER 3.03* 2.61* 2.25*  FERRITIN 500* 525* 614*  CRP <0.5 <0.5 <0.5    Lab Results  Component Value Date   SARSCOV2NAA POSITIVE (A) 11/18/2019    Patient able to tolerate about 8 H of heated high flow nasal cannula and prone position yesterday. Overnight back on non invasive mechanical ventilation. Yesterday had epistaxis.   Inflammatory markers continue to be elevated, patient able to responds to questions, no confusion or agitation, no evidence of increase work of breathing.   Plan to place patient again this am on heated high flow nasal canula and non rebreather mask, keep oxygen saturation more than 70%, monitor work of breathing and mentation.   As toleratedbronchodilator therapy and antitussive agents while on heated high flow nasal cannula.  On high dose of systemiccorticosteroids(methylprednsiolone 60 mg IV q12h). Continue with antithrombotic therapy with enoxaparin.   Continue to follow inflammatory markers, check chest film in am.   2. Suspected pulmonary embolism. Patient with a very high d dimer, high pre-test probability for PE. Unable to get CT chest due to  respiratory failure, high risk for transportation. [place] lower extremities 10/12, negative for DVT.    Patient with intermittent epistaxis, will continue close monitoring, for now continue with full dose anticoagulation.   3. Steroid induced hyperglycemia.Glucose has remained well controlled for now with fasting glucose 135, continue close monitoring and coverage with sliding scale.  Continue with steroids, patient with poor oral intake due to respiratory failure.   4. Metabolic encephalopathy.Clinically resolved, will continue with as needed lorazepam and haldol as needed.   5. Reactive leukocytosis/ oral thrush. Continue with nystatin and will follow up on cell count in am. No clinical signs of bacterial over infection.   6. Obesity class 2. BMI is 39. Prone as tolerated.   7. Stage 1 pressure ulcer/ bridge of the nose, not present on admission. On local skin care.   8. New hyperkalemia. K up to 5,2 with Na 138, bicarbonate 28 and cr at 1.13. Add 3 doses of sodium zirconium and follow up on renal function in am.   Patient continue to be at high risk for worsening respiratory failure, continue to be critically ill with imminent deterioration.  Critical care time 45 minutes.   Status is: Inpatient  Remains inpatient appropriate because:IV treatments appropriate due to intensity of illness or inability to take PO   Dispo: The patient is from: Home              Anticipated d/c is to: Home              Anticipated d/c date is: > 3 days              Patient currently is not medically stable to d/c.    DVT prophylaxis: Enoxaparin   Code Status:   full  Family Communication:   I spoke over the phone with the patient's sister about patient's  condition, plan of care, prognosis and all questions were addressed.    Nutrition Status:           Skin Documentation: Pressure Injury 12/02/19 Nose Upper Stage 1 -  Intact skin with non-blanchable redness of a localized area usually over a bony prominence. (Active)  12/02/19 2000  Location: Nose  Location Orientation:  Upper  Staging: Stage 1 -  Intact skin with non-blanchable redness of a localized area usually over a bony prominence.  Wound Description (Comments):   Present on Admission:      Consultants:  Pulmonary   Subjective: Patient this am comfortable on full face mask, tolerated well yesterday heated high flow plus non rebreather mask for about 8 H. Intermittent epistaxis. No nausea or vomiting.   Objective: Vitals:   12/05/19 0400 12/05/19 0437 12/05/19 0500 12/05/19 0600  BP: (!) 123/92 (!) 123/92 (!) 137/99 (!) 123/92  Pulse: 94 95 89 (!) 106  Resp: (!) 26 (!) 32 (!) 21 (!) 26  Temp: 98.4 F (36.9 C)     TempSrc: Oral     SpO2: 90% 91% 90% 93%  Weight:      Height:        Intake/Output Summary (Last 24 hours) at 12/05/2019 0802 Last data filed at 12/05/2019 0530 Gross per 24 hour  Intake --  Output 800 ml  Net -800 ml   Filed Weights   12/08/2019 2148 11/22/19 0946  Weight: 117.9 kg 118 kg    Examination:   General: deconditioned and ill looking appearing.  Neurology: Awake and alert, non focal.   E ENT: no pallor, no icterus, oral mucosa  moist/ this am on full face mask.  Cardiovascular: No JVD. S1-S2 present, rhythmic, no gallops, rubs, or murmurs. No lower extremity edema. Pulmonary: positive breath sounds bilaterally,no wheezing or rhonchi, bilateral  rales. Gastrointestinal. Abdomen soft and non tender Skin. No rashes Musculoskeletal: no joint deformities     Data Reviewed: I have personally reviewed following labs and imaging studies  CBC: Recent Labs  Lab 12/01/19 0301 12/03/19 0455 12/04/19 0237  WBC 21.5* 23.1* 23.8*  NEUTROABS  --  21.7*  --   HGB 16.0 15.7 15.4  HCT 46.9 47.7 46.5  MCV 91.1 94.3 94.5  PLT 267 223 229   Basic Metabolic Panel: Recent Labs  Lab 11/30/19 0307 11/30/19 0307 12/01/19 0301 12/02/19 0240 12/03/19 0455 12/04/19 0237 12/05/19 0247  NA 136   < > 135 135 138 136 138  K 4.4   < > 4.4 4.9 4.8 5.0 5.2*  CL 97*    < > 98 98 99 95* 98  CO2 26   < > 25 25 26 27 28   GLUCOSE 147*   < > 136* 136* 136* 130* 135*  BUN 43*   < > 42* 36* 43* 42* 45*  CREATININE 1.21   < > 1.13 1.02 1.10 1.05 1.13  CALCIUM 8.4*   < > 8.2* 8.4* 8.6* 8.7* 8.6*  MG 3.5*  --   --   --   --   --   --   PHOS 4.7*  --   --   --   --   --   --    < > = values in this interval not displayed.   GFR: Estimated Creatinine Clearance: 96.5 mL/min (by C-G formula based on SCr of 1.13 mg/dL). Liver Function Tests: Recent Labs  Lab 12/01/19 0301 12/02/19 0240 12/03/19 0455 12/04/19 0237 12/05/19 0247  AST 28 33 29 28 29   ALT 41 37 32 32 32  ALKPHOS 115 115 107 99 89  BILITOT 1.2 1.2 1.0 1.2 1.5*  PROT 6.8 6.9 6.8 6.7 6.7  ALBUMIN 3.5 3.5 3.6 3.4* 3.6   No results for input(s): LIPASE, AMYLASE in the last 168 hours. No results for input(s): AMMONIA in the last 168 hours. Coagulation Profile: No results for input(s): INR, PROTIME in the last 168 hours. Cardiac Enzymes: No results for input(s): CKTOTAL, CKMB, CKMBINDEX, TROPONINI in the last 168 hours. BNP (last 3 results) No results for input(s): PROBNP in the last 8760 hours. HbA1C: No results for input(s): HGBA1C in the last 72 hours. CBG: Recent Labs  Lab 12/04/19 1136 12/04/19 1703 12/04/19 2007 12/05/19 0022 12/05/19 0750  GLUCAP 143* 134* 118* 140* 146*   Lipid Profile: No results for input(s): CHOL, HDL, LDLCALC, TRIG, CHOLHDL, LDLDIRECT in the last 72 hours. Thyroid Function Tests: No results for input(s): TSH, T4TOTAL, FREET4, T3FREE, THYROIDAB in the last 72 hours. Anemia Panel: Recent Labs    12/04/19 0237 12/05/19 0247  FERRITIN 525* 614*      Radiology Studies: I have reviewed all of the imaging during this hospital visit personally     Scheduled Meds: . vitamin C  500 mg Oral Daily  . chlorhexidine  15 mL Mouth Rinse BID  . Chlorhexidine Gluconate Cloth  6 each Topical Daily  . chlorpheniramine-HYDROcodone  5 mL Oral Q12H  . docusate  sodium  100 mg Oral Daily  . enoxaparin (LOVENOX) injection  120 mg Subcutaneous Q12H  . insulin aspart  0-15 Units Subcutaneous TID WC  . insulin aspart  0-5 Units Subcutaneous QHS  . Ipratropium-Albuterol  1 puff Inhalation Q6H  . mouth rinse  15 mL Mouth Rinse q12n4p  . methylPREDNISolone (SOLU-MEDROL) injection  60 mg Intravenous Q12H  . nystatin  5 mL Oral QID  . polyethylene glycol  17 g Oral Daily  . senna  1 tablet Oral Daily  . zinc sulfate  220 mg Oral Daily   Continuous Infusions: . sodium chloride Stopped (11/23/19 0855)     LOS: 13 days        Stuart Singh Stuart Gulaaniel Davonn Flanery, MD

## 2019-12-06 ENCOUNTER — Inpatient Hospital Stay (HOSPITAL_COMMUNITY): Payer: BC Managed Care – PPO

## 2019-12-06 DIAGNOSIS — U071 COVID-19: Secondary | ICD-10-CM | POA: Diagnosis not present

## 2019-12-06 DIAGNOSIS — J1282 Pneumonia due to coronavirus disease 2019: Secondary | ICD-10-CM | POA: Diagnosis not present

## 2019-12-06 DIAGNOSIS — J9601 Acute respiratory failure with hypoxia: Secondary | ICD-10-CM | POA: Diagnosis not present

## 2019-12-06 DIAGNOSIS — G9341 Metabolic encephalopathy: Secondary | ICD-10-CM | POA: Diagnosis not present

## 2019-12-06 DIAGNOSIS — R739 Hyperglycemia, unspecified: Secondary | ICD-10-CM | POA: Diagnosis not present

## 2019-12-06 LAB — COMPREHENSIVE METABOLIC PANEL
ALT: 46 U/L — ABNORMAL HIGH (ref 0–44)
AST: 37 U/L (ref 15–41)
Albumin: 3.8 g/dL (ref 3.5–5.0)
Alkaline Phosphatase: 90 U/L (ref 38–126)
Anion gap: 15 (ref 5–15)
BUN: 59 mg/dL — ABNORMAL HIGH (ref 6–20)
CO2: 25 mmol/L (ref 22–32)
Calcium: 8.8 mg/dL — ABNORMAL LOW (ref 8.9–10.3)
Chloride: 102 mmol/L (ref 98–111)
Creatinine, Ser: 1.3 mg/dL — ABNORMAL HIGH (ref 0.61–1.24)
GFR, Estimated: >60 mL/min (ref >60)
Glucose, Bld: 148 mg/dL — ABNORMAL HIGH (ref 70–99)
Potassium: 5.6 mmol/L — ABNORMAL HIGH (ref 3.5–5.1)
Sodium: 142 mmol/L (ref 135–145)
Total Bilirubin: 1.8 mg/dL — ABNORMAL HIGH (ref 0.3–1.2)
Total Protein: 6.9 g/dL (ref 6.5–8.1)

## 2019-12-06 LAB — BLOOD GAS, ARTERIAL
Acid-base deficit: 2.6 mmol/L — ABNORMAL HIGH (ref 0.0–2.0)
Acid-base deficit: 5.6 mmol/L — ABNORMAL HIGH (ref 0.0–2.0)
Acid-base deficit: 5.5 mmol/L — ABNORMAL HIGH (ref 0.0–2.0)
Bicarbonate: 25.4 mmol/L (ref 20.0–28.0)
Bicarbonate: 24.7 mmol/L (ref 20.0–28.0)
Bicarbonate: 23.6 mmol/L (ref 20.0–28.0)
Drawn by: 308601
Drawn by: 560031
FIO2: 100
FIO2: 100
MECHVT: 410 mL
MECHVT: 410 mL
O2 Saturation: 92.5 %
O2 Saturation: 85.7 %
O2 Saturation: 84.9 %
PEEP: 12 cmH2O
PEEP: 12 cmH2O
Patient temperature: 99.6
Patient temperature: 98.8
Patient temperature: 98.8
RATE: 30 resp/min
RATE: 35 resp/min
pCO2 arterial: 60.5 mmHg — ABNORMAL HIGH (ref 32.0–48.0)
pCO2 arterial: 70.2 mmHg (ref 32.0–48.0)
pCO2 arterial: 62.8 mmHg — ABNORMAL HIGH (ref 32.0–48.0)
pH, Arterial: 7.25 — ABNORMAL LOW (ref 7.350–7.450)
pH, Arterial: 7.172 — CL (ref 7.350–7.450)
pH, Arterial: 7.2 — ABNORMAL LOW (ref 7.350–7.450)
pO2, Arterial: 84.6 mmHg (ref 83.0–108.0)
pO2, Arterial: 68.2 mmHg — ABNORMAL LOW (ref 83.0–108.0)
pO2, Arterial: 62.7 mmHg — ABNORMAL LOW (ref 83.0–108.0)

## 2019-12-06 LAB — CBC WITH DIFFERENTIAL/PLATELET
Abs Immature Granulocytes: 0.97 10*3/uL — ABNORMAL HIGH (ref 0.00–0.07)
Basophils Absolute: 0.1 10*3/uL (ref 0.0–0.1)
Basophils Relative: 0 %
Eosinophils Absolute: 0 10*3/uL (ref 0.0–0.5)
Eosinophils Relative: 0 %
HCT: 46.7 % (ref 39.0–52.0)
Hemoglobin: 15.4 g/dL (ref 13.0–17.0)
Immature Granulocytes: 3 %
Lymphocytes Relative: 2 %
Lymphs Abs: 0.8 10*3/uL (ref 0.7–4.0)
MCH: 31.1 pg (ref 26.0–34.0)
MCHC: 33 g/dL (ref 30.0–36.0)
MCV: 94.3 fL (ref 80.0–100.0)
Monocytes Absolute: 0.8 10*3/uL (ref 0.1–1.0)
Monocytes Relative: 2 %
Neutro Abs: 32.5 10*3/uL — ABNORMAL HIGH (ref 1.7–7.7)
Neutrophils Relative %: 93 %
Platelets: 273 10*3/uL (ref 150–400)
RBC: 4.95 MIL/uL (ref 4.22–5.81)
RDW: 13.8 % (ref 11.5–15.5)
WBC: 35.1 10*3/uL — ABNORMAL HIGH (ref 4.0–10.5)
nRBC: 0.1 % (ref 0.0–0.2)

## 2019-12-06 LAB — BASIC METABOLIC PANEL
Anion gap: 16 — ABNORMAL HIGH (ref 5–15)
Anion gap: 18 — ABNORMAL HIGH (ref 5–15)
BUN: 71 mg/dL — ABNORMAL HIGH (ref 6–20)
BUN: 77 mg/dL — ABNORMAL HIGH (ref 6–20)
CO2: 22 mmol/L (ref 22–32)
CO2: 23 mmol/L (ref 22–32)
Calcium: 8.5 mg/dL — ABNORMAL LOW (ref 8.9–10.3)
Calcium: 8.4 mg/dL — ABNORMAL LOW (ref 8.9–10.3)
Chloride: 100 mmol/L (ref 98–111)
Chloride: 99 mmol/L (ref 98–111)
Creatinine, Ser: 1.7 mg/dL — ABNORMAL HIGH (ref 0.61–1.24)
Creatinine, Ser: 2.36 mg/dL — ABNORMAL HIGH (ref 0.61–1.24)
GFR, Estimated: 48 mL/min — ABNORMAL LOW (ref >60)
GFR, Estimated: 33 mL/min — ABNORMAL LOW (ref >60)
Glucose, Bld: 149 mg/dL — ABNORMAL HIGH (ref 70–99)
Glucose, Bld: 152 mg/dL — ABNORMAL HIGH (ref 70–99)
Potassium: 6 mmol/L — ABNORMAL HIGH (ref 3.5–5.1)
Potassium: 6 mmol/L — ABNORMAL HIGH (ref 3.5–5.1)
Sodium: 138 mmol/L (ref 135–145)
Sodium: 140 mmol/L (ref 135–145)

## 2019-12-06 LAB — GLUCOSE, CAPILLARY
Glucose-Capillary: 140 mg/dL — ABNORMAL HIGH (ref 70–99)
Glucose-Capillary: 186 mg/dL — ABNORMAL HIGH (ref 70–99)
Glucose-Capillary: 170 mg/dL — ABNORMAL HIGH (ref 70–99)
Glucose-Capillary: 179 mg/dL — ABNORMAL HIGH (ref 70–99)
Glucose-Capillary: 190 mg/dL — ABNORMAL HIGH (ref 70–99)

## 2019-12-06 LAB — D-DIMER, QUANTITATIVE: D-Dimer, Quant: 1.98 ug/mL-FEU — ABNORMAL HIGH (ref 0.00–0.50)

## 2019-12-06 LAB — PHOSPHORUS
Phosphorus: 9.5 mg/dL — ABNORMAL HIGH (ref 2.5–4.6)
Phosphorus: 10.7 mg/dL — ABNORMAL HIGH (ref 2.5–4.6)

## 2019-12-06 LAB — FERRITIN: Ferritin: 682 ng/mL — ABNORMAL HIGH (ref 24–336)

## 2019-12-06 LAB — C-REACTIVE PROTEIN: CRP: <0.5 mg/dL (ref <1.0)

## 2019-12-06 LAB — MAGNESIUM
Magnesium: 4.1 mg/dL — ABNORMAL HIGH (ref 1.7–2.4)
Magnesium: 3.8 mg/dL — ABNORMAL HIGH (ref 1.7–2.4)

## 2019-12-06 LAB — PROCALCITONIN: Procalcitonin: 0.44 ng/mL

## 2019-12-06 MED ORDER — INSULIN REGULAR NICU BOLUS VIA INFUSION: 5.0000 [IU] | Freq: Once | INTRAVENOUS | Status: DC

## 2019-12-06 MED ORDER — SODIUM CHLORIDE 0.9 % IV SOLN
2.0000 g | Freq: Three times a day (TID) | INTRAVENOUS | Status: DC
  Administered 2019-12-06 (×2): 2 g via INTRAVENOUS
  Filled 2019-12-06 (×2): qty 2

## 2019-12-06 MED ORDER — INSULIN ASPART 100 UNIT/ML ~~LOC~~ SOLN
5.0000 [IU] | SUBCUTANEOUS | Status: DC
  Administered 2019-12-06 – 2019-12-09 (×19): 5 [IU] via SUBCUTANEOUS

## 2019-12-06 MED ORDER — ORAL CARE MOUTH RINSE
15.0000 mL | OROMUCOSAL | Status: DC
  Administered 2019-12-06 – 2019-12-09 (×29): 15 mL via OROMUCOSAL

## 2019-12-06 MED ORDER — SODIUM ZIRCONIUM CYCLOSILICATE 10 G PO PACK
10.0000 g | PACK | Freq: Three times a day (TID) | ORAL | Status: AC
  Administered 2019-12-06 (×3): 10 g
  Filled 2019-12-06 (×3): qty 1

## 2019-12-06 MED ORDER — DOCUSATE SODIUM 50 MG/5ML PO LIQD
100.0000 mg | Freq: Two times a day (BID) | ORAL | Status: DC
  Administered 2019-12-06 (×2): 100 mg via ORAL
  Filled 2019-12-06 (×3): qty 10

## 2019-12-06 MED ORDER — CISATRACURIUM BOLUS VIA INFUSION
0.0500 mg/kg | Freq: Once | INTRAVENOUS | Status: AC
  Administered 2019-12-06: 5.1 mg via INTRAVENOUS
  Filled 2019-12-06: qty 6

## 2019-12-06 MED ORDER — VANCOMYCIN HCL 1750 MG/350ML IV SOLN
1750.0000 mg | Freq: Once | INTRAVENOUS | Status: AC
  Administered 2019-12-06: 1750 mg via INTRAVENOUS
  Filled 2019-12-06: qty 350

## 2019-12-06 MED ORDER — PROPOFOL 1000 MG/100ML IV EMUL
0.0000 ug/kg/min | INTRAVENOUS | Status: DC
  Administered 2019-12-06 (×2): 50 ug/kg/min via INTRAVENOUS
  Administered 2019-12-06: 45 ug/kg/min via INTRAVENOUS
  Administered 2019-12-06: 50 ug/kg/min via INTRAVENOUS
  Administered 2019-12-06: 20 ug/kg/min via INTRAVENOUS
  Administered 2019-12-06 (×3): 50 ug/kg/min via INTRAVENOUS
  Administered 2019-12-07 (×2): 40 ug/kg/min via INTRAVENOUS
  Administered 2019-12-07: 50 ug/kg/min via INTRAVENOUS
  Administered 2019-12-07: 40 ug/kg/min via INTRAVENOUS
  Filled 2019-12-06: qty 200
  Filled 2019-12-06: qty 100
  Filled 2019-12-06: qty 200
  Filled 2019-12-06 (×2): qty 100
  Filled 2019-12-06: qty 200
  Filled 2019-12-06: qty 100

## 2019-12-06 MED ORDER — POLYETHYLENE GLYCOL 3350 17 G PO PACK: 17.0000 g | PACK | Freq: Every day | ORAL | Status: DC

## 2019-12-06 MED ORDER — PHENYLEPHRINE 40 MCG/ML (10ML) SYRINGE FOR IV PUSH (FOR BLOOD PRESSURE SUPPORT)
PREFILLED_SYRINGE | INTRAVENOUS | Status: AC
  Filled 2019-12-06: qty 10

## 2019-12-06 MED ORDER — INSULIN ASPART 100 UNIT/ML IV SOLN: 5.0000 [IU] | Freq: Once | INTRAVENOUS | Status: DC

## 2019-12-06 MED ORDER — PROPOFOL 500 MG/50ML IV EMUL
INTRAVENOUS | Status: AC
  Filled 2019-12-06: qty 50

## 2019-12-06 MED ORDER — CHLORHEXIDINE GLUCONATE 0.12% ORAL RINSE (MEDLINE KIT)
15.0000 mL | Freq: Two times a day (BID) | OROMUCOSAL | Status: DC
  Administered 2019-12-07 – 2019-12-09 (×5): 15 mL via OROMUCOSAL

## 2019-12-06 MED ORDER — INSULIN ASPART 100 UNIT/ML ~~LOC~~ SOLN
0.0000 [IU] | SUBCUTANEOUS | Status: DC
  Administered 2019-12-06 – 2019-12-07 (×5): 3 [IU] via SUBCUTANEOUS

## 2019-12-06 MED ORDER — FENTANYL CITRATE (PF) 100 MCG/2ML IJ SOLN
INTRAMUSCULAR | Status: AC
  Administered 2019-12-06: 100 ug
  Filled 2019-12-06: qty 2

## 2019-12-06 MED ORDER — ARTIFICIAL TEARS OPHTHALMIC OINT
1.0000 "application " | TOPICAL_OINTMENT | Freq: Three times a day (TID) | OPHTHALMIC | Status: DC
  Administered 2019-12-06 – 2019-12-09 (×10): 1 via OPHTHALMIC
  Filled 2019-12-06: qty 3.5

## 2019-12-06 MED ORDER — VITAL HIGH PROTEIN PO LIQD
1000.0000 mL | ORAL | Status: AC
  Administered 2019-12-06: 1000 mL

## 2019-12-06 MED ORDER — VITAL AF 1.2 CAL PO LIQD
1000.0000 mL | ORAL | Status: DC
  Administered 2019-12-06 – 2019-12-09 (×4): 1000 mL

## 2019-12-06 MED ORDER — VANCOMYCIN HCL 750 MG/150ML IV SOLN: 750.0000 mg | Freq: Two times a day (BID) | INTRAVENOUS | Status: DC

## 2019-12-06 MED ORDER — PROSOURCE TF PO LIQD
45.0000 mL | Freq: Two times a day (BID) | ORAL | Status: DC
  Administered 2019-12-06: 45 mL
  Filled 2019-12-06: qty 45

## 2019-12-06 MED ORDER — PROSOURCE TF PO LIQD
90.0000 mL | Freq: Every day | ORAL | Status: DC
  Administered 2019-12-06 – 2019-12-09 (×14): 90 mL
  Filled 2019-12-06 (×13): qty 90

## 2019-12-06 MED ORDER — ROCURONIUM BROMIDE 10 MG/ML (PF) SYRINGE
PREFILLED_SYRINGE | INTRAVENOUS | Status: AC
  Administered 2019-12-06: 120 mg
  Filled 2019-12-06: qty 10

## 2019-12-06 MED ORDER — DEXTROSE-NACL 5-0.45 % IV SOLN: INTRAVENOUS | Status: DC

## 2019-12-06 MED ORDER — VANCOMYCIN HCL 2000 MG/400ML IV SOLN
2000.0000 mg | Freq: Once | INTRAVENOUS | Status: DC
  Filled 2019-12-06: qty 400

## 2019-12-06 MED ORDER — FENTANYL 2500MCG IN NS 250ML (10MCG/ML) PREMIX INFUSION
0.0000 ug/h | INTRAVENOUS | Status: DC
  Administered 2019-12-06: 50 ug/h via INTRAVENOUS
  Administered 2019-12-06: 125 ug/h via INTRAVENOUS
  Administered 2019-12-07: 150 ug/h via INTRAVENOUS
  Administered 2019-12-08: 200 ug/h via INTRAVENOUS
  Administered 2019-12-08: 275 ug/h via INTRAVENOUS
  Administered 2019-12-08 – 2019-12-09 (×3): 325 ug/h via INTRAVENOUS
  Filled 2019-12-06 (×8): qty 250

## 2019-12-06 MED ORDER — DEXTROSE 50 % IV SOLN
25.0000 g | Freq: Once | INTRAVENOUS | Status: DC
  Filled 2019-12-06: qty 50

## 2019-12-06 MED ORDER — ROCURONIUM BROMIDE 10 MG/ML (PF) SYRINGE
PREFILLED_SYRINGE | INTRAVENOUS | Status: AC
  Filled 2019-12-06: qty 10

## 2019-12-06 MED ORDER — ETOMIDATE 2 MG/ML IV SOLN
INTRAVENOUS | Status: AC
  Administered 2019-12-06: 20 mg
  Filled 2019-12-06: qty 20

## 2019-12-06 MED ORDER — SODIUM CHLORIDE 0.9 % IV SOLN
0.0000 ug/kg/min | INTRAVENOUS | Status: DC
  Administered 2019-12-06 – 2019-12-07 (×3): 3 ug/kg/min via INTRAVENOUS
  Administered 2019-12-08: 3.5 ug/kg/min via INTRAVENOUS
  Administered 2019-12-08: 5.5 ug/kg/min via INTRAVENOUS
  Administered 2019-12-08 – 2019-12-09 (×3): 3.5 ug/kg/min via INTRAVENOUS
  Filled 2019-12-06 (×14): qty 20

## 2019-12-06 MED ORDER — FENTANYL CITRATE (PF) 100 MCG/2ML IJ SOLN: 50.0000 ug | INTRAMUSCULAR | Status: DC | PRN

## 2019-12-06 MED ORDER — FENTANYL CITRATE (PF) 100 MCG/2ML IJ SOLN
50.0000 ug | INTRAMUSCULAR | Status: DC | PRN
  Administered 2019-12-06 (×2): 100 ug via INTRAVENOUS
  Administered 2019-12-06: 50 ug via INTRAVENOUS
  Administered 2019-12-07: 100 ug via INTRAVENOUS
  Filled 2019-12-06: qty 2

## 2019-12-06 MED ORDER — VECURONIUM BROMIDE 10 MG IV SOLR
0.0800 mg/kg | INTRAVENOUS | Status: DC | PRN
  Administered 2019-12-06 (×3): 9.4 mg via INTRAVENOUS
  Filled 2019-12-06 (×3): qty 10

## 2019-12-06 MED ORDER — SODIUM CHLORIDE 0.9 % IV SOLN
2.0000 g | Freq: Two times a day (BID) | INTRAVENOUS | Status: DC
  Administered 2019-12-07: 2 g via INTRAVENOUS
  Filled 2019-12-06 (×2): qty 2

## 2019-12-06 NOTE — TOC Progression Note (Signed)
Transition of Care [facility]) - Progression Note    Patient Details  Name: Stuart Singh MRN: [id number] Date of Birth: November 07, 1970  [facility] of [facility] CM/SW Contact  Golda Acre, RN Phone Number: 12/06/2019, 9:44 AM  Clinical Narrative:    Significant Hospital Events   10/10 Admit with COVID PNA   10/12 On bipap 10/14 60L flow, 100% FiO2 10/18 70L flow, 100% FiO2 + NRB 10/19 70L flow, 100% fiO2 + NRB / unchanged. Asking for breakfast. Prone overnight. Did not wear BiPAP 10/20 70L flow, 100% fiO2, +NRB 10/21 On BiPAP, desaturation to 30's with change to heated high flow / recovered  10/22 Remains on BiPAP 100%, 5 PEEP 10/25 intubated.  [id number] solu medrol, iv d50 x1, iv maxipime,vanco, iv sedation, d.dimer 1.98, wbc-35.1 Following for progression and toc needs.   Expected Discharge Plan: Home w Home Health Services Barriers to Discharge: Barriers Unresolved (comment)  Expected Discharge Plan and Services Expected Discharge Plan: Home w Home Health Services   Discharge Planning Services: CM Consult   Living arrangements for the past 2 months: Single Family Home                                       Social Determinants of Health (SDOH) Interventions    Readmission Risk Interventions No flowsheet data found.

## 2019-12-06 NOTE — Progress Notes (Signed)
Initial Nutrition Assessment  DOCUMENTATION CODES:   Obesity unspecified  INTERVENTION:  - will adjust TF regimen: Vital 1.2 @ 20 ml/hr to advance by 10 ml every 12 hours to reach goal rate of 50 ml/hr with 90 ml Prosource TF x5/day (10 packets/day). - at goal rate, this regimen + kcal from current propofol rate will provide 2774 kcal, 200 grams protein, 973 ml free water.  - free water flush to be per CCM.  - weigh patient today.   NUTRITION DIAGNOSIS:   Increased nutrient needs related to acute illness, catabolic illness (COVID-19 infection) as evidenced by estimated needs.  GOAL:   Provide needs based on ASPEN/SCCM guidelines  MONITOR:   Vent status, TF tolerance, Labs, Weight trends  REASON FOR ASSESSMENT:   Ventilator, Consult Enteral/tube feeding initiation and management  ASSESSMENT:   49 year-old male with no PMH admitted with acute hypoxic respiratory failure with bilateral infiltrates in the setting of COVID PNA. Required heated HFNC, intermittent BiPAP and was subsequently intubated on 10/25.  Patient was intubated today at ~0545. Small bore NGT placed and currently 89 cm at the nare. Abdominal xray report states tube is looped in the stomach with tip in the proximal stomach.   Able to talk with RN. Patient was proned at ~1225. He is receiving Vital High Protein @ 20 ml/hr.   He has not been weighed since 10/11.   Per notes: - ARDS 2/2 COVID PNA - AKI - agitated delirum   Patient is currently intubated on ventilator support MV: 11.3 L/min Temp (24hrs), Avg:97.5 F (36.4 C), Min:96.9 F (36.1 C), Max:98.2 F (36.8 C) Propofol: 35.4 ml/hr (934 kcal)  Labs reviewed; CBGs: 140 and 186 mg/dl, K: 6 mmol/l, BUN: 71 mg/dl, creatinine: 1.7 mg/dl, Ca: 8.5 mg/dl, Phos: 9.5 mg/dl, Mg: 4.1 mg/dl, GFR: 48 ml/min.  Medications reviewed; 100 mg colace BID, sliding scale novolog, 5 units novolog every 4 hours, 60 mg solu-medrol BID, 1 tablet senokot/day, 5 ml  mycostatin QID, 10 g lokelma x1 dose/day 10/25-10/27,  IVF; D5-1/2 NS @ 50 ml/hr (204 kcal).  Drips; fentanyl @ 50 mcg/hr, propofol @ 50 mcg/kg/min.     NUTRITION - FOCUSED PHYSICAL EXAM:  completed; no muscle or fat depletions; mild edema to BLE.   Diet Order:   Diet Order    None      EDUCATION NEEDS:   No education needs have been identified at this time  Skin:  Skin Assessment: Skin Integrity Issues: Skin Integrity Issues:: Stage I, Other (Comment) Stage I: upper nose (10/21) Other: skin tear to scrotum (10/19); IAD to bilateral scrotum (10/22)  Last BM:  10/21 (type 4)  Height:   Ht Readings from Last 1 Encounters:  11/22/19 5\' 8"  (1.727 m)    Weight:   Wt Readings from Last 1 Encounters:  11/22/19 118 kg    Estimated Nutritional Needs:  Kcal:  2640 kcal (30 kcal/kg adjBW) Protein:  177-200 grams (1.7-2 grams/kg) Fluid:  >/= 2.5 L/day     01/22/20, MS, RD, LDN, CNSC Inpatient Clinical Dietitian RD pager # available in [facility]  After hours/weekend pager # available in [facility]

## 2019-12-06 NOTE — Progress Notes (Signed)
eLink Physician-Brief Progress Note Patient Name: Stuart Singh DOB: 1970/05/09 MRN: [id number]   Date of Service  12/06/2019  HPI/Events of Note  Fever to 101.3 F. Allergy to Tylenol. Creatinine = 2.36. Therefore, can' t use Tylenol or Motrin. Patient has had a tracheal aspirate culture sent this afternoon.   eICU Interventions  Plan: 1. Ice packs PRN.  2. Blood cultures X 2 now.      Intervention Category Major Interventions: Other:  Lenell Antu 12/06/2019, 8:54 PM

## 2019-12-06 NOTE — Progress Notes (Signed)
PCCM Progress Note  Asked to evaluate Mr. Depaul for epistaxis and increased work of breathing after supinating and coming off of NIV.  Impressions: # Acute hypoxic respiratory failure due to covid-pneumonitis # Pneumomediastinum and possible small R pneumothorax # Epistaxis # Suspected PE  Plan: - Given epistaxis and need for packing for hemostasis, saturation in low 80s even on BiPAP, proceeded with intubation - lung protective settings, low peep ARDSnet table at most in setting of pneumomediastinum and possible small right PTX - fentanyl and propofol for RASS -2 to -3 - on steroid wean - would hold lovenox for now - CTM possible small right PTX and pneumomediastinum - if dealing with a lot of secretions today would check sputum and blood cultures and start ABX for HAP  Judithann Graves PGY-6 Pulmonary/Critical Care

## 2019-12-06 NOTE — Progress Notes (Addendum)
NAME:  Stuart Singh, MRN:  [id number], DOB:  [birth year], LOS: 14 ADMISSION DATE:  17-Dec-2019, CONSULTATION DATE:  10/11 REFERRING MD:  Dr. Ronaldo Miyamoto CHIEF COMPLAINT:  SOB, COVID PNA   Brief History   49 y/o M admitted with acute hypoxic respiratory failure with bilateral infiltrates in the setting of COVID PNA. Required heated HFNC, intermittent BiPAP.    Past Medical History  COVID  Significant Hospital Events   10/10 Admit with COVID PNA   10/12 On bipap 10/14 60L flow, 100% FiO2 10/18 70L flow, 100% FiO2 + NRB 10/19 70L flow, 100% fiO2 + NRB / unchanged. Asking for breakfast. Prone overnight. Did not wear BiPAP 10/20 70L flow, 100% fiO2, +NRB 10/21 On BiPAP, desaturation to 30's with change to heated high flow / recovered  10/22 Remains on BiPAP 100%, 5 PEEP 10/25 intubated.   Consults:      Procedures:     Significant Diagnostic Tests:  10/12 ECHO >> LVEF 60-65%, no RWMA, RV systolic function normal  Micro Data:  COVID 10/10 >> positive  Influenza A/B 10/10 >> negative BCx2 10/10 >> negative  MRSA PCR 10/10 >> negative   Antimicrobials/COVID RX  Tociluzimab X 2- 10/11, 10/12  Remdesivir 10/12 >> 10/14 Solumedrol 10/12 >>   Interim history/subjective:  Intubated this am. Requiring increased sedation   Objective   Blood pressure 139/82, pulse (Abnormal) 137, temperature (Abnormal) 97.5 F (36.4 C), temperature source Axillary, resp. rate (Abnormal) 34, height 5\' 8"  (1.727 m), weight 118 kg, SpO2 (Abnormal) 84 %.    Vent Mode: PRVC FiO2 (%):  [100 %] 100 % Set Rate:  [30 bmp] 30 bmp Vt Set:  [410 mL] 410 mL PEEP:  [12 cmH20] 12 cmH20 Plateau Pressure:  [27 cmH20] 27 cmH20  No intake or output data in the 24 hours ending 12/06/19 0836 Filed Weights   Dec 17, 2019 2148 11/22/19 0946  Weight: 117.9 kg 118 kg    Examination: General 50 year old black male sedated on vent HENT NCAT no JVD MMM oETT in place Pulm: vent setting fio2 100/peep 12 rr 30 vt 410 pplat 34/  driving pressure 22 equal chest rise, decreased bilaterally . sats 86% and desaturates easily Card tachy rrr abd soft. Now has FT in nare Neuro heavily sedated Ext warm and dry + pulses brisk CR GU condom cath min UOP  Resolved Hospital Problem list      Assessment & Plan:   Acute Hypoxic Respiratory Failure in setting of Diffuse Bilateral Infiltrates/ARDS due to COVID PNA -Completed remdesivir, recieved Actemra 10/11, 10/12.   -Post intubation portable chest x-ray shows endotracheal tube in satisfactory position mild cardiomegaly right greater than left airspace disease possibly a little more pronounced today, has had stable [place] air and pneumomediastinum -Has been on full dose anticoagulation -Arterial blood gas evaluated P/F ratio 84, with respiratory acidosis as well Plan ARDS protocol with low tidal volume ventilation Plateau pressure goal less than 30, driving pressure goal less than 15 PaO2 goal 55-65 Initiate proning protocol Heavy sedation with RASS goal -4 to -5 Continue propofol, add fentanyl, add as needed neuromuscular blockade We will continue Solu-Medrol at current dosing which is 60 mg every 12, however he has been on systemic steroids for over 2 weeks with no improvement, will start back down tomorrow on the 26th Respiratory culture Check procalcitonin, see below Prone protocol as long as P/F ratio less than 150 A.m. chest x-ray  Leukocytosis, white blood cell count continues to climb Given clinical decline once  again reasonable to initiate empiric HCAP coverage Plan Sending cultures as mentioned above Empiric vancomycin and cefepime Narrow as cultures dictate  Elevated D-Dimer, Rule Out PE D-dimer increased to > 20.  LE duplex and ECHO negative.  -unable to get CTA due to respiratory status   Plan Continue therapeutic full dose low molecular weight heparin  Acute kidney injury with mild hyperkalemia Had been aggressively diuresed, creatinine continues to  climb Plan Lokelma x1 IV hydration today Holding diuresis Avoid nephrotoxins Central venous pressure, goal with CVP 8-12  Agitated Delirium  Improved, suspect steroids and hypoxia are playing role here, also likely hypoxia from time to time Plan PAD protocol  Hyperglycemia Plan Sliding scale insulin Tube feed coverage  Best practice:  Diet: Regular, as tolerated  Pain/Anxiety/Delirium protocol (if indicated): n/a  VAP protocol (if indicated): n/a  DVT prophylaxis: Lovenox  GI prophylaxis: n/a Glucose control: per primary  Mobility: as tolerated Code Status: Full Code.  Confirmed 10/20 that if necessary, he would want intubation.  Family Communication: Per primary.  Called sister [name]") for update 10/20 Disposition: ICU  My cct 45 min Simonne Martinet ACNP-BC [facility] Pulmonary/Critical Care Pager # [phone] OR # [phone] if no answer

## 2019-12-06 NOTE — Progress Notes (Signed)
PHARMACY NOTE:  ANTIMICROBIAL RENAL DOSAGE ADJUSTMENT  Current antimicrobial regimen includes a mismatch between antimicrobial dosage and estimated renal function.  As per policy approved by the Pharmacy & Therapeutics and Medical Executive Committees, the antimicrobial dosage will be adjusted accordingly.  Current antimicrobial dosage:  Cefepime 2g IV q8h  Indication: pna     Renal Function: CrCl 30-60 ml/min  Estimated Creatinine Clearance: 42.7 mL/min (A) (by C-G formula based on SCr of 2.36 mg/dL (H)). []      On intermittent HD, scheduled: []      On CRRT    Antimicrobial dosage has been changed to:  Cefepime 2g IV q12h  Additional comments:   Thank you for allowing pharmacy to be a part of this patient's care.  , [facility] 12/06/2019 9:04 PM

## 2019-12-06 NOTE — Procedures (Signed)
Endotracheal Intubation Procedure Note  Indication for endotracheal intubation: respiratory failure and epistaxis . Airway Assessment: Mallampati Class: III (soft and hard palate and base of uvula visible). Sedation: etomidate. Paralytic: rocuronium. Lidocaine: no. Equipment: glidescope s4. Cricoid Pressure: no. Number of attempts: 1. Grade IIa view. ETT location confirmed by by auscultation and ETCO2 monitor.  Stuart Singh 12/06/2019

## 2019-12-06 NOTE — Progress Notes (Signed)
Pt proned at 1225 with no complications noted. Vitals stable at this time, RT will continue to monitor.

## 2019-12-06 NOTE — Procedures (Signed)
Central Venous Catheter Insertion Procedure Note  Stuart Singh  [id number]  [birth year]  Date:12/06/19  Time:11:35 AM   Provider Performing:Stuart Singh   Procedure: Insertion of Non-tunneled Central Venous [phone]) with US guidance ([id number])   Indication(s) Medication administration and Difficult access  Consent Risks of the procedure as well as the alternatives and risks of each were explained to the patient and/or caregiver.  Consent for the procedure was obtained and is signed in the bedside chart  Anesthesia Topical only with 1% lidocaine   Timeout Verified patient identification, verified procedure, site/side was marked, verified correct patient position, special equipment/implants available, medications/allergies/relevant history reviewed, required imaging and test results available.  Sterile Technique Maximal sterile technique including full sterile barrier drape, hand hygiene, sterile gown, sterile gloves, mask, hair covering, sterile ultrasound probe cover (if used).  Procedure Description Area of catheter insertion was cleaned with chlorhexidine and draped in sterile fashion.  With real-time ultrasound guidance a central venous catheter was placed into the left internal jugular vein. Nonpulsatile blood flow and easy flushing noted in all ports.  The catheter was sutured in place and sterile dressing applied.  Complications/Tolerance None; patient tolerated the procedure well. Chest X-ray is ordered to verify placement for internal jugular or subclavian cannulation.   Chest x-ray is not ordered for femoral cannulation.  EBL Minimal  Specimen(s) None  Stuart Singh ACNP-BC [facility] Pulmonary/Critical Care Pager # [phone] OR # [phone] if no answer

## 2019-12-06 NOTE — Progress Notes (Signed)
Pharmacy Antibiotic Note  Stuart Singh is a 49 y.o. male admitted on 11/18/2019 with pneumonia.  Pharmacy has been consulted for vancomycin dosing.  Plan:  Vancomycin 1750 mg IV now, then 750 mg IV q12 hr  Measure vancomycin levels at steady state as indicated  SCr q48 while on vanc  Cefepime per MD; dosing appropriate  Consider repeating MRSA PCR   Height: 5\' 8"  (172.7 cm) Weight: 118 kg (260 lb 2.3 oz) IBW/kg (Calculated) : 68.4  Temp (24hrs), Avg:97.5 F (36.4 C), Min:96.9 F (36.1 C), Max:98.2 F (36.8 C)  Recent Labs  Lab 12/01/19 0301 12/02/19 0240 12/03/19 0455 12/04/19 0237 12/05/19 0247 12/06/19 0320 12/06/19 1002  WBC 21.5*  --  23.1* 23.8*  --  35.1*  --   CREATININE 1.13   < > 1.10 1.05 1.13 1.30* 1.70*   < > = values in this interval not displayed.    Estimated Creatinine Clearance: 64.1 mL/min (A) (by C-G formula based on SCr of 1.7 mg/dL (H)).    Allergies  Allergen Reactions  . Codeine Rash  . Anacin-3 [Acetaminophen]     Tylenol 3    Antimicrobials this admission: 10/25 vancomycin >>  10/25 cefepime >>   Dose adjustments this admission: n/a  Microbiology results: 10/10 BCx: NGF 10/11 MRSA PCR: neg  Thank you for allowing pharmacy to be a part of this patient's care.  Stuart Singh A 12/06/2019 2:28 PM

## 2019-12-06 NOTE — Progress Notes (Addendum)
eLink Physician-Brief Progress Note Patient Name: Stuart Singh DOB: [birth year] MRN: [id number]   Date of Service  12/06/2019  HPI/Events of Note  Camera: Epistaxis and desaturated to low 40's. On NRB.  Nasal pressure held and NRB replaced. Able to protect airways. Called bed side CCM Dr Thora Lance to evaluate. VS: BP stable.  Labs stable. On Lovenox 1mg  q12 hr.  K elevated.   eICU Interventions  - Hyperkalemia Rx. - NRB - intubate if worsening again. - aspiration precautions - try rhino rocket. - hold Lovenox. Until stable.      Intervention Category Major Interventions: Respiratory failure - evaluation and management;Hemorrhage - evaluation and management  12/06/2019, 4:51 AM   5:22 AM Getting ready for intubation. On BiPAP sats 78 still.

## 2019-12-06 NOTE — Progress Notes (Signed)
CRITICAL VALUE ALERT  Critical Value:  Small right-sided pneumothorax is identified new from previous exam. This measures approximately 1.3 cm measured over the right apex  Date & Time Notied:  12/06/2019 1:21 PM   Provider Notified: MD Durel Salts  Orders Received/Actions taken: No new orders received, no interventions at this time. Pneumo is "too small for tube" at this time. Will continue to monitor patient respiratory status and repeat cxrays.

## 2019-12-06 NOTE — Progress Notes (Signed)
Called patient's sister to inform her the patient had been placed on a ventilator.  Explained her brother's oxygen saturation levels dropped during the night and he experienced a nose bleed.  Care team was concerned the patient could aspirate and thought it best to protect his airway.  Sister understood why her brother was placed on a ventilator and asked no questions.

## 2019-12-07 ENCOUNTER — Inpatient Hospital Stay (HOSPITAL_COMMUNITY): Payer: BC Managed Care – PPO

## 2019-12-07 DIAGNOSIS — G9341 Metabolic encephalopathy: Secondary | ICD-10-CM | POA: Diagnosis not present

## 2019-12-07 DIAGNOSIS — N179 Acute kidney failure, unspecified: Secondary | ICD-10-CM | POA: Diagnosis not present

## 2019-12-07 DIAGNOSIS — J9601 Acute respiratory failure with hypoxia: Secondary | ICD-10-CM | POA: Diagnosis not present

## 2019-12-07 DIAGNOSIS — J80 Acute respiratory distress syndrome: Secondary | ICD-10-CM

## 2019-12-07 DIAGNOSIS — U071 COVID-19: Secondary | ICD-10-CM | POA: Diagnosis not present

## 2019-12-07 LAB — CBC
HCT: 45.3 % (ref 39.0–52.0)
Hemoglobin: 14.3 g/dL (ref 13.0–17.0)
MCH: 31.8 pg (ref 26.0–34.0)
MCHC: 31.6 g/dL (ref 30.0–36.0)
MCV: 100.7 fL — ABNORMAL HIGH (ref 80.0–100.0)
Platelets: 231 10*3/uL (ref 150–400)
RBC: 4.5 MIL/uL (ref 4.22–5.81)
RDW: 13.6 % (ref 11.5–15.5)
WBC: 52.9 10*3/uL (ref 4.0–10.5)
nRBC: 0.9 % — ABNORMAL HIGH (ref 0.0–0.2)

## 2019-12-07 LAB — BASIC METABOLIC PANEL
Anion gap: 17 — ABNORMAL HIGH (ref 5–15)
BUN: 125 mg/dL — ABNORMAL HIGH (ref 6–20)
CO2: 25 mmol/L (ref 22–32)
Calcium: 7.1 mg/dL — ABNORMAL LOW (ref 8.9–10.3)
Chloride: 97 mmol/L — ABNORMAL LOW (ref 98–111)
Creatinine, Ser: 4.88 mg/dL — ABNORMAL HIGH (ref 0.61–1.24)
GFR, Estimated: 14 mL/min — ABNORMAL LOW (ref >60)
Glucose, Bld: 225 mg/dL — ABNORMAL HIGH (ref 70–99)
Potassium: 5 mmol/L (ref 3.5–5.1)
Sodium: 139 mmol/L (ref 135–145)

## 2019-12-07 LAB — COMPREHENSIVE METABOLIC PANEL
ALT: 164 U/L — ABNORMAL HIGH (ref 0–44)
AST: 101 U/L — ABNORMAL HIGH (ref 15–41)
Albumin: 2.8 g/dL — ABNORMAL LOW (ref 3.5–5.0)
Alkaline Phosphatase: 65 U/L (ref 38–126)
Anion gap: 17 — ABNORMAL HIGH (ref 5–15)
BUN: 109 mg/dL — ABNORMAL HIGH (ref 6–20)
CO2: 26 mmol/L (ref 22–32)
Calcium: 7.4 mg/dL — ABNORMAL LOW (ref 8.9–10.3)
Chloride: 96 mmol/L — ABNORMAL LOW (ref 98–111)
Creatinine, Ser: 4.17 mg/dL — ABNORMAL HIGH (ref 0.61–1.24)
GFR, Estimated: 16 mL/min — ABNORMAL LOW (ref >60)
Glucose, Bld: 219 mg/dL — ABNORMAL HIGH (ref 70–99)
Potassium: 5.3 mmol/L — ABNORMAL HIGH (ref 3.5–5.1)
Sodium: 139 mmol/L (ref 135–145)
Total Bilirubin: 1.5 mg/dL — ABNORMAL HIGH (ref 0.3–1.2)
Total Protein: 5 g/dL — ABNORMAL LOW (ref 6.5–8.1)

## 2019-12-07 LAB — BLOOD GAS, ARTERIAL
Acid-base deficit: 8.4 mmol/L — ABNORMAL HIGH (ref 0.0–2.0)
Acid-base deficit: 2.7 mmol/L — ABNORMAL HIGH (ref 0.0–2.0)
Bicarbonate: 21.2 mmol/L (ref 20.0–28.0)
Bicarbonate: 26 mmol/L (ref 20.0–28.0)
Drawn by: 44126
FIO2: 100
FIO2: 100
MECHVT: 410 mL
O2 Saturation: 89.8 %
O2 Saturation: 90.3 %
Patient temperature: 99.5
Patient temperature: 98.6
RATE: 35 resp/min
pCO2 arterial: 65.2 mmHg (ref 32.0–48.0)
pCO2 arterial: 67.3 mmHg (ref 32.0–48.0)
pH, Arterial: 7.143 — CL (ref 7.350–7.450)
pH, Arterial: 7.212 — ABNORMAL LOW (ref 7.350–7.450)
pO2, Arterial: 69.6 mmHg — ABNORMAL LOW (ref 83.0–108.0)
pO2, Arterial: 65.5 mmHg — ABNORMAL LOW (ref 83.0–108.0)

## 2019-12-07 LAB — PHOSPHORUS
Phosphorus: 12.1 mg/dL — ABNORMAL HIGH (ref 2.5–4.6)
Phosphorus: 13.5 mg/dL — ABNORMAL HIGH (ref 2.5–4.6)

## 2019-12-07 LAB — GLUCOSE, CAPILLARY
Glucose-Capillary: 200 mg/dL — ABNORMAL HIGH (ref 70–99)
Glucose-Capillary: 190 mg/dL — ABNORMAL HIGH (ref 70–99)
Glucose-Capillary: 202 mg/dL — ABNORMAL HIGH (ref 70–99)
Glucose-Capillary: 218 mg/dL — ABNORMAL HIGH (ref 70–99)
Glucose-Capillary: 218 mg/dL — ABNORMAL HIGH (ref 70–99)
Glucose-Capillary: 221 mg/dL — ABNORMAL HIGH (ref 70–99)

## 2019-12-07 LAB — LIPASE, BLOOD: Lipase: 159 U/L — ABNORMAL HIGH (ref 11–51)

## 2019-12-07 LAB — PROCALCITONIN: Procalcitonin: 2.32 ng/mL

## 2019-12-07 LAB — HEPARIN LEVEL (UNFRACTIONATED): Heparin Unfractionated: 1.58 IU/mL — ABNORMAL HIGH (ref 0.30–0.70)

## 2019-12-07 LAB — PROTIME-INR
INR: 1.3 — ABNORMAL HIGH (ref 0.8–1.2)
Prothrombin Time: 15.6 seconds — ABNORMAL HIGH (ref 11.4–15.2)

## 2019-12-07 LAB — SODIUM, URINE, RANDOM: Sodium, Ur: 21 mmol/L

## 2019-12-07 LAB — MAGNESIUM
Magnesium: 3.8 mg/dL — ABNORMAL HIGH (ref 1.7–2.4)
Magnesium: 3.7 mg/dL — ABNORMAL HIGH (ref 1.7–2.4)

## 2019-12-07 LAB — TRIGLYCERIDES: Triglycerides: 1476 mg/dL — ABNORMAL HIGH (ref <150)

## 2019-12-07 LAB — CREATININE, URINE, RANDOM: Creatinine, Urine: 161.52 mg/dL

## 2019-12-07 MED ORDER — SODIUM POLYSTYRENE SULFONATE 15 GM/60ML PO SUSP
30.0000 g | Freq: Once | ORAL | Status: AC
  Administered 2019-12-07: 30 g
  Filled 2019-12-07: qty 120

## 2019-12-07 MED ORDER — SODIUM BICARBONATE 8.4 % IV SOLN
100.0000 meq | Freq: Once | INTRAVENOUS | Status: AC
  Administered 2019-12-07: 100 meq via INTRAVENOUS
  Filled 2019-12-07: qty 100

## 2019-12-07 MED ORDER — POLYETHYLENE GLYCOL 3350 17 G PO PACK
17.0000 g | PACK | Freq: Every day | ORAL | Status: DC
  Administered 2019-12-07: 17 g

## 2019-12-07 MED ORDER — LINEZOLID 600 MG/300ML IV SOLN
600.0000 mg | Freq: Two times a day (BID) | INTRAVENOUS | Status: DC
  Administered 2019-12-07 – 2019-12-08 (×3): 600 mg via INTRAVENOUS
  Filled 2019-12-07 (×3): qty 300

## 2019-12-07 MED ORDER — METHYLPREDNISOLONE SODIUM SUCC 40 MG IJ SOLR
40.0000 mg | Freq: Two times a day (BID) | INTRAMUSCULAR | Status: DC
  Administered 2019-12-07 – 2019-12-09 (×4): 40 mg via INTRAVENOUS
  Filled 2019-12-07 (×4): qty 1

## 2019-12-07 MED ORDER — SODIUM CHLORIDE 0.9 % IV BOLUS
1000.0000 mL | Freq: Once | INTRAVENOUS | Status: AC
  Administered 2019-12-07: 1000 mL via INTRAVENOUS

## 2019-12-07 MED ORDER — "THROMBI-PAD 3""X3"" EX PADS"
1.0000 | MEDICATED_PAD | Freq: Once | CUTANEOUS | Status: AC
  Administered 2019-12-07: 1 via TOPICAL
  Filled 2019-12-07: qty 1

## 2019-12-07 MED ORDER — PANTOPRAZOLE SODIUM 40 MG IV SOLR
40.0000 mg | Freq: Every day | INTRAVENOUS | Status: DC
  Administered 2019-12-07 – 2019-12-08 (×2): 40 mg via INTRAVENOUS
  Filled 2019-12-07 (×2): qty 40

## 2019-12-07 MED ORDER — DOCUSATE SODIUM 50 MG/5ML PO LIQD
100.0000 mg | Freq: Two times a day (BID) | ORAL | Status: DC
  Administered 2019-12-07 – 2019-12-09 (×4): 100 mg
  Filled 2019-12-07 (×4): qty 10

## 2019-12-07 MED ORDER — SODIUM ZIRCONIUM CYCLOSILICATE 10 G PO PACK
10.0000 g | PACK | Freq: Three times a day (TID) | ORAL | Status: AC
  Administered 2019-12-07 (×3): 10 g
  Filled 2019-12-07 (×3): qty 1

## 2019-12-07 MED ORDER — INSULIN ASPART 100 UNIT/ML ~~LOC~~ SOLN
0.0000 [IU] | SUBCUTANEOUS | Status: DC
  Administered 2019-12-07 (×3): 7 [IU] via SUBCUTANEOUS
  Administered 2019-12-07 – 2019-12-08 (×6): 4 [IU] via SUBCUTANEOUS
  Administered 2019-12-09: 3 [IU] via SUBCUTANEOUS
  Administered 2019-12-09 (×3): 4 [IU] via SUBCUTANEOUS

## 2019-12-07 MED ORDER — PHENYLEPHRINE HCL 0.5 % NA SOLN
1.0000 [drp] | NASAL | Status: DC | PRN
  Filled 2019-12-07: qty 15

## 2019-12-07 MED ORDER — SODIUM CHLORIDE 0.9 % IV SOLN
2.0000 g | INTRAVENOUS | Status: DC
  Administered 2019-12-07: 2 g via INTRAVENOUS
  Filled 2019-12-07: qty 2

## 2019-12-07 MED ORDER — INSULIN GLARGINE 100 UNIT/ML ~~LOC~~ SOLN
10.0000 [IU] | Freq: Two times a day (BID) | SUBCUTANEOUS | Status: DC
  Administered 2019-12-07 – 2019-12-08 (×4): 10 [IU] via SUBCUTANEOUS
  Filled 2019-12-07 (×5): qty 0.1

## 2019-12-07 MED ORDER — HEPARIN (PORCINE) 25000 UT/250ML-% IV SOLN: 1250.0000 [IU]/h | INTRAVENOUS | Status: DC

## 2019-12-07 MED ORDER — MIDAZOLAM 50MG/50ML (1MG/ML) PREMIX INFUSION
0.0000 mg/h | INTRAVENOUS | Status: DC
  Administered 2019-12-07: 2 mg/h via INTRAVENOUS
  Administered 2019-12-07: 4 mg/h via INTRAVENOUS
  Administered 2019-12-08: 10 mg/h via INTRAVENOUS
  Administered 2019-12-08: 6 mg/h via INTRAVENOUS
  Administered 2019-12-08: 10 mg/h via INTRAVENOUS
  Administered 2019-12-08: 4 mg/h via INTRAVENOUS
  Administered 2019-12-09 (×2): 10 mg/h via INTRAVENOUS
  Filled 2019-12-07 (×9): qty 50

## 2019-12-07 MED ORDER — MIDAZOLAM BOLUS VIA INFUSION
1.0000 mg | INTRAVENOUS | Status: DC | PRN
  Administered 2019-12-07: 1 mg via INTRAVENOUS
  Administered 2019-12-09: 2 mg via INTRAVENOUS
  Filled 2019-12-07: qty 2

## 2019-12-07 MED ORDER — STERILE WATER FOR INJECTION IV SOLN
INTRAVENOUS | Status: DC
  Filled 2019-12-07: qty 850
  Filled 2019-12-07: qty 150

## 2019-12-07 NOTE — Progress Notes (Signed)
Patient profusely bleeding from nose and mouth in prone position, bilateral rhino rockets placed per order, unable to stop bleeding in prone position, approx EBL by suction. Returned to supine position to control bleeding. Will keep rhino rockets in place at this time and if bleeding does not subside, will soak replacement rhino rockets in Afrin and replace. Bleeding decreased in supine position, will continue to monitor.   Central line bleeding significantly, thrombi pad placed instead of biopatch, nursing staff to continue to monitor.   Will hold Heparin drip overnight per MD [name], will re-eval in the AM.

## 2019-12-07 NOTE — Progress Notes (Addendum)
[facility] for IV heparin Indication: pulmonary embolus (presumed)  Allergies  Allergen Reactions  . Codeine Rash  . Anacin-3 [Acetaminophen]     Tylenol 3    Patient Measurements: Height: 5' 8"  (172.7 cm) Weight: 104 kg (229 lb 4.5 oz) IBW/kg (Calculated) : 68.4 Heparin Dosing Weight: 90 kg  Vital Signs: Temp: 98.2 F (36.8 C) (10/26 1200) Temp Source: Axillary (10/26 1200) BP: 112/69 (10/26 0700) Pulse Rate: 108 (10/26 1211)  Labs: Recent Labs    12/06/19 0320 12/06/19 0320 12/06/19 1002 12/06/19 1757 12/07/19 0403 12/07/19 1053  HGB 15.4  --   --   --  14.3  --   HCT 46.7  --   --   --  45.3  --   PLT 273  --   --   --  231  --   LABPROT  --   --   --   --   --  15.6*  INR  --   --   --   --   --  1.3*  HEPARINUNFRC  --   --   --   --   --  1.58*  CREATININE 1.30*   < > 1.70* 2.36*  --  4.17*   < > = values in this interval not displayed.    Estimated Creatinine Clearance: 24.5 mL/min (A) (by C-G formula based on SCr of 4.17 mg/dL (H)).   Medical History: Past Medical History:  Diagnosis Date  . COVID-19 11/2019    Medications:  Scheduled:  . artificial tears  1 application Both Eyes S3M  . chlorhexidine  15 mL Mouth Rinse BID  . chlorhexidine gluconate (MEDLINE KIT)  15 mL Mouth Rinse BID  . Chlorhexidine Gluconate Cloth  6 each Topical Daily  . dextrose  25 g Intravenous Once  . docusate  100 mg Per Tube BID  . feeding supplement (PROSource TF)  90 mL Per Tube 5 X Daily  . feeding supplement (VITAL AF 1.2 CAL)  1,000 mL Per Tube Q24H  . insulin aspart  0-20 Units Subcutaneous Q4H  . insulin aspart  5 Units Subcutaneous Q4H  . insulin glargine  10 Units Subcutaneous BID  . mouth rinse  15 mL Mouth Rinse 10 times per day  . methylPREDNISolone (SOLU-MEDROL) injection  40 mg Intravenous Q12H  . nystatin  5 mL Oral QID  . polyethylene glycol  17 g Per Tube Daily  . senna  1 tablet Oral Daily  . sodium zirconium  cyclosilicate  10 g Per Tube TID   Infusions:  . sodium chloride Stopped (11/23/19 0855)  . ceFEPime (MAXIPIME) IV Stopped (12/07/19 0514)  . cisatracurium (NIMBEX) infusion 3 mcg/kg/min (12/07/19 0700)  . dextrose 5 % and 0.45% NaCl 10 mL/hr at 12/07/19 1000  . fentaNYL infusion INTRAVENOUS 125 mcg/hr (12/07/19 0700)  . heparin    . linezolid (ZYVOX) IV 600 mg (12/07/19 1311)  . midazolam 2 mg/hr (12/07/19 1336)    Assessment: 11 yoM with COVID PNA on vent and presumed PE (too unstable for imaging), previously on Lovenox 120 mg q12 hr. Now with acutely worsening renal function and Pharmacy to transition LMWH to IV heparin infusion. Note change in documented weight from 119 to 104 kg on 10/25; (confirmed w/ RN) but pt continued on 120 mg q12. Additionally, anticipated accumulation of LMWH d/t AKI and thus a baseline anti-Xa level was drawn prior to starting heparin   Baseline INR mildly elevated  Anti-Xa level  drawn ~12 hr after last dose of Lovenox was above that desired for 4-hr peak levels  Prior anticoagulation: LMWH 120 mg q12 hr; LD 10/25 at 2250  Significant events:  Today, 12/07/2019:  CBC: stable WNL  SCr rising rapidly; 4x increase in 48 hr  Some bleeding around central line site and epistaxis noted this AM (could be explained by supratherapeutic LMWH); improved now per RN  Goal of Therapy: Heparin level 0.3-0.7 units/ml Monitor platelets by anticoagulation protocol: Yes  Plan:  Difficult to interpret non-peak LMWH levels, but given that baseline HL was > 1.0, safe to assume patient is truly supratherapeutic. Based on current renal function, will start heparin 24 hr after last LMWH dose at conservative rate and w/o bolus  Heparin 1250 units/hr IV infusion  Check heparin level 8 hrs after start - anticipate there may be some lingering effect from LMWH  Daily CBC, daily heparin level once stable  Monitor for signs of bleeding or thrombosis   Per standing  hospital antibiotic policy, will also adjust Cefepime from 2g IV q12 >> q24 hr with CrCl now < 30 ml/min  Reuel Boom, PharmD, BCPS [phone] 12/07/2019, 2:00 PM

## 2019-12-07 NOTE — Progress Notes (Signed)
During patient proning/repositioning to stomach, nose bleed re-occurred, thin/bright red frank blood noted from nose and mouth. Patient previously had old dried blood noted upon suction. Rhino rocket placed.   CCM NP P. Tanja Port and Pharmacist Bernadene Person notified of bleeding. Will continue to monitor and notify night time provider if bleeding continues. CCM NP okay to start Heparin gtt at 10 PM on 10/26 pending bleeding does not worsen/controlled by rhino rocket.

## 2019-12-07 NOTE — Progress Notes (Signed)
Patient proned at this time (1554).

## 2019-12-07 NOTE — Progress Notes (Signed)
eLink Physician-Brief Progress Note Patient Name: Stuart Singh DOB: 04/23/1970 MRN: [id number]   Date of Service  12/07/2019  HPI/Events of Note  Hypoxia - Sat = 70% on 100%/PRVC 35/TV 410/P 12. Desaturation occurred very abruptly. History of R pneumothorax which has resolved on AM CXR.  eICU Interventions  Plan: 1. Portable CXR STAT.      Intervention Category Major Interventions: Hypoxemia - evaluation and management  Mikhaila Roh [name] 12/07/2019, 10:21 PM

## 2019-12-07 NOTE — Progress Notes (Signed)
eLink Physician-Brief Progress Note Patient Name: Stuart Singh DOB: [birth year] MRN: [id number]   Date of Service  12/07/2019  HPI/Events of Note  ABG on 100%/PRVC 35/TV 410/P 12 = 7.143/65.2/69.6.  eICU Interventions  Plan: 1. NaHCO3 100 meq IV now. 2. NaHCO3 IV infusion to run IV at 100 mL/hour.  3. Repeat ABG at 9 AM.     Intervention Category Major Interventions: Acid-Base disturbance - evaluation and management;Respiratory failure - evaluation and management  Carrine Kroboth [name] 12/07/2019, 6:05 AM

## 2019-12-07 NOTE — Progress Notes (Signed)
Patient unproned due to increased bleeding from nose. ETT still in place and secure.

## 2019-12-07 NOTE — Progress Notes (Signed)
eLink Physician-Brief Progress Note Patient Name: Stuart Singh DOB: 04-Oct-1970 MRN: [id number]   Date of Service  12/07/2019  HPI/Events of Note  Review of CXR reveals no evidence for pneumothorax by my read.   eICU Interventions  Plan: 1. Increase PEEP to 14. 2. Recruitment maneuver PRN.     Intervention Category Major Interventions: Hypoxemia - evaluation and management  Deeann Servidio [name] 12/07/2019, 10:46 PM

## 2019-12-07 NOTE — Progress Notes (Addendum)
NAME:  Stuart Singh, MRN:  [id number], DOB:  [birth year], LOS: 15 ADMISSION DATE:  11/25/2019, CONSULTATION DATE:  10/11 REFERRING MD:  Dr. Ronaldo Miyamoto CHIEF COMPLAINT:  SOB, COVID PNA   Brief History   49 y/o M admitted with acute hypoxic respiratory failure with bilateral infiltrates in the setting of COVID PNA. Required heated HFNC, intermittent BiPAP.    Past Medical History  COVID  Significant Hospital Events   10/10 Admit with COVID PNA   10/12 On bipap 10/14 60L flow, 100% FiO2 10/18 70L flow, 100% FiO2 + NRB 10/19 70L flow, 100% fiO2 + NRB / unchanged. Asking for breakfast. Prone overnight. Did not wear BiPAP 10/20 70L flow, 100% fiO2, +NRB 10/21 On BiPAP, desaturation to 30's with change to heated high flow / recovered  10/22 Remains on BiPAP 100%, 5 PEEP 10/25 intubated.  Worsening renal failure.  Hyperkalemia.  Placed on ards protocol.  Required heavy sedation, neuromuscular blockade, and proning, small apical pneumothorax noted on right felt too small to decompress 10/26: Worsening gas exchange after placing back in supine position.  Renal failure worse.  Still hyperkalemic.  Now on bicarbonate infusion in neuromuscular blockade infusion.  Overall declining on all fronts.nephro consulted.   Consults:     Nephrology 10/26>>> Procedures:  Endotracheal tube placed by anesthesia 10/25>>> Left IJ triple-lumen catheter placed 10/25>>>>  Significant Diagnostic Tests:  10/12 ECHO >> LVEF 60-65%, no RWMA, RV systolic function normal Renal US 10/26>>> Micro Data:  COVID 10/10 >> positive  Influenza A/B 10/10 >> negative BCx2 10/10 >> negative  MRSA PCR 10/10 >> negative  Respiratory culture 10/25: Antimicrobials/COVID RX  Tociluzimab X 2- 10/11, 10/12  Remdesivir 10/12 >> 10/14 Solumedrol 10/12 >> dose reduced to 40 mg every 12 on 10/26>>> Vancomycin 10/25>>>10/26 zyvox 10/26>>> Cefepime 10/25>>>  Interim history/subjective:  Continues to decline, minute ventilation  maximized.  Bicarbonate drip started to allow for permissive hypercarbia andTo keep pH greater than 7.2.  Nursing staff reporting bleeding from central line and nose.  Objective   Blood pressure 112/69, pulse (Abnormal) 110, temperature 99.3 F (37.4 C), temperature source Axillary, resp. rate (Abnormal) 35, height 5\' 8"  (1.727 m), weight 104 kg, SpO2 (Abnormal) 86 %. CVP:  [2 mmHg-7 mmHg] 4 mmHg  Vent Mode: PRVC FiO2 (%):  [100 %] 100 % Set Rate:  [30 bmp-35 bmp] 35 bmp Vt Set:  [410 mL] 410 mL PEEP:  [12 cmH20] 12 cmH20 Plateau Pressure:  [25 cmH20-33 cmH20] 25 cmH20   Intake/Output Summary (Last 24 hours) at 12/07/2019 0831 Last data filed at 12/07/2019 0700 Gross per 24 hour  Intake 3164.85 ml  Output 300 ml  Net 2864.85 ml   Filed Weights   11/22/19 0946 12/06/19 1600 12/07/19 0439  Weight: 118 kg 101.1 kg 104 kg    Examination: General this is an otherwise healthy but now critically ill 49 year old male currently heavily sedated on mechanical ventilator and in the supine position HEENT normocephalic atraumatic orally intubated left IJ dressing unremarkable no JVD appreciated mucous membranes moist Pulmonary: Diminished bilaterally. Current ventilator settings respiratory rate 35 tidal volume 410 PEEP 12 FiO2 90% Plateau pressures currently 27 Resulting in driving pressure on current settings of 15. Cardiac: Regular rate and rhythm current central venous pressure of five slightly tachycardic Extremities warm dry with brisk capillary refill pulses are strong Neuro: Heavily sedated on propofol and fentanyl infusion also receiving neuromuscular blockade to facilitate ventilator synchrony GU concentrated yellow urine Resolved Hospital Problem list  Assessment & Plan:   Acute Hypoxic Respiratory Failure in setting of Diffuse Bilateral Infiltrates/ARDS due to COVID PNA Progressive acute respiratory acidosis (10/26) -Completed remdesivir, recieved Actemra 10/11, 10/12.     Portable chest x-ray personally reviewed Shows endotracheal tube in satisfactory position. Comparing film from day prior right-sided aeration perhaps a little better but still has diffuse airspace disease, left side actually looks worse today -Arterial blood gas reviewed for this morning, worsening respiratory acidosis and ongoing hypoxia, this was after placing him back in the supine position->ve maximized and not able to increase Vt given Pplats Plan We will continue low tidal volume ventilation at 102ml per kilogram predicted body weight PaO2 goal 55-65 Saturation goal greater than 85 Continuing proning protocol as P/F ratio is still less than 150 in the supine position Plateau pressure goal less than 30/driving pressure goal less than 15 Cont NMB gtt (started this am); cont heavy sedation w/ diprivan and fent gtts RASS goal -4 to -5 abx per below VAP bundle PAD protocol per below  Dec solumedrol to 40 BID; has not really helped to date Allowing for permissive hypercarbia Bicarb gtt to keep Ph > 7.2 Cont serial abgs  Right sided PTX (about 5% as of 10/25) Portable chest x-ray personally reviewed: Endotracheal tube in line in satisfactory position, still appears to be very tiny right apical pneumothorax certainly not larger Plan Repeat chest x-ray later this afternoon Currently has PEEP of 12, will limit him here is no chest tube in place If increased in size I think we should go ahead and place Chest tube to ensure we can maximize Ve At this time no chest tube warranted  Acute metabolic encephalopathy with agitated delirium Now heavily sedated and receiving intermittent neuromuscular blockade Plan RASS goal -4 to -5 to facilitate mechanical ventilation Will need to change propofol to versed given rising TGs.  Check lipase  Fever & Leukocytosis, his white blood cells continue to climb, initiated broad-spectrum antibiotics on 10/25, but procalcitonin was unremarkable initially now  climbing.  Currently his respiratory culture is growing moderate GPC and rare GPR Plan Day #2 vancomycin and cefepime (changing vanc to zyvox given worsening renal fxn) Follow-up pending cultures Trend white blood cell count and fever curve Checking lipase  Elevated D-Dimer, Rule Out PE D-dimer increased to > 20.  LE duplex and ECHO negative.  -unable to get CTA due to respiratory status   Plan Continuing therapeutic low molecular weight heparin, with renal function declining we may need to consider changing to IV heparin  Bleeding from central line and nasal cavity Platelets within normal Plan Check INR Given worsening renal function may need to change to IV heparin from low molecular weight heparin, I will speak to pharmacy  Acute kidney injury with mild hyperkalemia Worsening renal function, hyperkalemia, but making urine, I suspect acidosis may also be contributing to hyperkalemia Plan Lokelma once again today Kayexalate via tube He will need nephrology consultation, I suspect will need CRRT @ some point His CVP is only five we'll give 1 L bolus of saline  Renal US Place foley and send urine Na and creatinine  Change vanc to zyvox to limit potential nephrotoxins  hyperglycemia: Currently with poor glycemic control Plan We will increase to resistant scale Add Lantus 10 units every 12 Blood glucose goal 140-180   Best practice:  Diet: Regular, as tolerated  Pain/Anxiety/Delirium protocol (if indicated): n/a  VAP protocol (if indicated): n/a  DVT prophylaxis: Lovenox  GI prophylaxis: n/a Glucose control:  per primary  Mobility: as tolerated Code Status: Full Code.  Confirmed 10/20 that if necessary, he would want intubation.  Family Communication: Per primary.  Called sister [name]") for update 10/20 Disposition: ICU   My critical care time is 42 minutes Simonne Martinet ACNP-BC [facility] Pulmonary/Critical Care Pager # [phone] OR # [phone] if no answer

## 2019-12-07 NOTE — Progress Notes (Signed)
Patient turned supine at approximately 0430. Patient febrile, ice packs refilled and replaced. BP soft, adjusted dose of Propofol to maintain SBP >90. Patient continues to have bleeding from nostrils, did not attempt to dislodge crust/clots. Also slight bleeding around central line. Continuing to monitor.

## 2019-12-08 ENCOUNTER — Inpatient Hospital Stay (HOSPITAL_COMMUNITY): Payer: BC Managed Care – PPO

## 2019-12-08 ENCOUNTER — Encounter (HOSPITAL_COMMUNITY): Payer: Self-pay | Admitting: Internal Medicine

## 2019-12-08 DIAGNOSIS — G9341 Metabolic encephalopathy: Secondary | ICD-10-CM | POA: Diagnosis not present

## 2019-12-08 DIAGNOSIS — J1520 Pneumonia due to staphylococcus, unspecified: Secondary | ICD-10-CM

## 2019-12-08 DIAGNOSIS — J80 Acute respiratory distress syndrome: Secondary | ICD-10-CM | POA: Diagnosis not present

## 2019-12-08 DIAGNOSIS — U071 COVID-19: Secondary | ICD-10-CM | POA: Diagnosis not present

## 2019-12-08 DIAGNOSIS — N170 Acute kidney failure with tubular necrosis: Secondary | ICD-10-CM

## 2019-12-08 DIAGNOSIS — J9601 Acute respiratory failure with hypoxia: Secondary | ICD-10-CM | POA: Diagnosis not present

## 2019-12-08 LAB — BLOOD GAS, ARTERIAL
Acid-Base Excess: 1 mmol/L (ref 0.0–2.0)
Acid-base deficit: 2.7 mmol/L — ABNORMAL HIGH (ref 0.0–2.0)
Bicarbonate: 26.7 mmol/L (ref 20.0–28.0)
Bicarbonate: 29.6 mmol/L — ABNORMAL HIGH (ref 20.0–28.0)
Drawn by: 23532
Drawn by: 441261
FIO2: 100
FIO2: 100
MECHVT: 410 mL
MECHVT: 410 mL
O2 Saturation: 79.1 %
O2 Saturation: 82.7 %
PEEP: 14 cmH2O
PEEP: 16 cmH2O
Patient temperature: 100.2
Patient temperature: 98.6
RATE: 35 resp/min
RATE: 35 resp/min
pCO2 arterial: 81.1 mmHg (ref 32.0–48.0)
pCO2 arterial: 75.4 mmHg (ref 32.0–48.0)
pH, Arterial: 7.151 — CL (ref 7.350–7.450)
pH, Arterial: 7.218 — ABNORMAL LOW (ref 7.350–7.450)
pO2, Arterial: 55 mmHg — ABNORMAL LOW (ref 83.0–108.0)
pO2, Arterial: 54.4 mmHg — ABNORMAL LOW (ref 83.0–108.0)

## 2019-12-08 LAB — BASIC METABOLIC PANEL
Anion gap: 20 — ABNORMAL HIGH (ref 5–15)
Anion gap: 18 — ABNORMAL HIGH (ref 5–15)
BUN: 145 mg/dL — ABNORMAL HIGH (ref 6–20)
BUN: 157 mg/dL — ABNORMAL HIGH (ref 6–20)
CO2: 24 mmol/L (ref 22–32)
CO2: 32 mmol/L (ref 22–32)
Calcium: 7 mg/dL — ABNORMAL LOW (ref 8.9–10.3)
Calcium: 6.5 mg/dL — ABNORMAL LOW (ref 8.9–10.3)
Chloride: 94 mmol/L — ABNORMAL LOW (ref 98–111)
Chloride: 92 mmol/L — ABNORMAL LOW (ref 98–111)
Creatinine, Ser: 5.98 mg/dL — ABNORMAL HIGH (ref 0.61–1.24)
Creatinine, Ser: 7.2 mg/dL — ABNORMAL HIGH (ref 0.61–1.24)
GFR, Estimated: 11 mL/min — ABNORMAL LOW (ref >60)
GFR, Estimated: 9 mL/min — ABNORMAL LOW (ref >60)
Glucose, Bld: 207 mg/dL — ABNORMAL HIGH (ref 70–99)
Glucose, Bld: 181 mg/dL — ABNORMAL HIGH (ref 70–99)
Potassium: 4.3 mmol/L (ref 3.5–5.1)
Potassium: 4.5 mmol/L (ref 3.5–5.1)
Sodium: 138 mmol/L (ref 135–145)
Sodium: 142 mmol/L (ref 135–145)

## 2019-12-08 LAB — CBC
HCT: 31.7 % — ABNORMAL LOW (ref 39.0–52.0)
Hemoglobin: 10.4 g/dL — ABNORMAL LOW (ref 13.0–17.0)
MCH: 31.9 pg (ref 26.0–34.0)
MCHC: 32.8 g/dL (ref 30.0–36.0)
MCV: 97.2 fL (ref 80.0–100.0)
Platelets: 137 10*3/uL — ABNORMAL LOW (ref 150–400)
RBC: 3.26 MIL/uL — ABNORMAL LOW (ref 4.22–5.81)
RDW: 14.2 % (ref 11.5–15.5)
WBC: 36 10*3/uL — ABNORMAL HIGH (ref 4.0–10.5)
nRBC: 2.9 % — ABNORMAL HIGH (ref 0.0–0.2)

## 2019-12-08 LAB — GLUCOSE, CAPILLARY
Glucose-Capillary: 185 mg/dL — ABNORMAL HIGH (ref 70–99)
Glucose-Capillary: 158 mg/dL — ABNORMAL HIGH (ref 70–99)
Glucose-Capillary: 188 mg/dL — ABNORMAL HIGH (ref 70–99)
Glucose-Capillary: 178 mg/dL — ABNORMAL HIGH (ref 70–99)
Glucose-Capillary: 165 mg/dL — ABNORMAL HIGH (ref 70–99)
Glucose-Capillary: 169 mg/dL — ABNORMAL HIGH (ref 70–99)

## 2019-12-08 LAB — LACTIC ACID, PLASMA
Lactic Acid, Venous: 1.9 mmol/L (ref 0.5–1.9)
Lactic Acid, Venous: 2.7 mmol/L (ref 0.5–1.9)

## 2019-12-08 LAB — PROCALCITONIN: Procalcitonin: 2.65 ng/mL

## 2019-12-08 LAB — TRIGLYCERIDES: Triglycerides: 646 mg/dL — ABNORMAL HIGH (ref <150)

## 2019-12-08 MED ORDER — SODIUM BICARBONATE 8.4 % IV SOLN
100.0000 meq | Freq: Once | INTRAVENOUS | Status: AC
  Filled 2019-12-08: qty 50

## 2019-12-08 MED ORDER — CEFAZOLIN SODIUM-DEXTROSE 2-4 GM/100ML-% IV SOLN
2.0000 g | Freq: Two times a day (BID) | INTRAVENOUS | Status: DC
  Administered 2019-12-08 – 2019-12-09 (×2): 2 g via INTRAVENOUS
  Filled 2019-12-08 (×3): qty 100

## 2019-12-08 MED ORDER — SODIUM BICARBONATE 8.4 % IV SOLN
INTRAVENOUS | Status: AC
  Administered 2019-12-08: 100 meq via INTRAVENOUS
  Filled 2019-12-08: qty 50

## 2019-12-08 MED ORDER — SODIUM CHLORIDE 0.9 % IV BOLUS
1000.0000 mL | Freq: Once | INTRAVENOUS | Status: AC
  Administered 2019-12-08: 1000 mL via INTRAVENOUS

## 2019-12-08 MED ORDER — NOREPINEPHRINE 16 MG/250ML-% IV SOLN
0.0000 ug/min | INTRAVENOUS | Status: DC
  Administered 2019-12-08: 4 ug/min via INTRAVENOUS
  Filled 2019-12-08: qty 250

## 2019-12-08 MED ORDER — STERILE WATER FOR INJECTION IV SOLN
INTRAVENOUS | Status: DC
  Filled 2019-12-08: qty 850
  Filled 2019-12-08 (×3): qty 150
  Filled 2019-12-08: qty 850

## 2019-12-08 NOTE — Procedures (Signed)
Intubation Procedure Note  Rhyan Radler  [id number]  1970/05/13  Date:12/08/19  Time:12:22 PM   Provider Performing:Pete E Tanja Port    Procedure: Intubation (31500)  Indication(s) Respiratory Failure  Consent Unable to obtain consent due to emergent nature of procedure.   Anesthesia already on versed, fent and NMB infusion    Time Out Verified patient identification, verified procedure, site/side was marked, verified correct patient position, special equipment/implants available, medications/allergies/relevant history reviewed, required imaging and test results available.   Sterile Technique Usual hand hygeine, masks, and gloves were used   Procedure Description Patient positioned in bed supine.  Sedation given as noted above.  Patient was intubated with endotracheal tube using Glidescope.  View was Grade 3 only epiglottis .  Number of attempts was 1.  Colorimetric CO2 detector was consistent with tracheal placement.   Complications/Tolerance pt remained hypoxic; was slow to recruit at time of procedure note sats now 39% Chest X-ray is ordered to verify placement.   EBL Minimal   Specimen(s) None Simonne Martinet ACNP-BC [facility] Pulmonary/Critical Care Pager # [phone] OR # [phone] if no answer

## 2019-12-08 NOTE — Progress Notes (Addendum)
eLink Physician-Brief Progress Note Patient Name: Stuart Singh DOB: Jun 12, 1970 MRN: [id number]   Date of Service  12/08/2019  HPI/Events of Note  ABG on 100%/PRVC 35/TV 410/P 14 = 7.151/81.1/55.   eICU Interventions  Plan: 1. NaHCO3 100 meq IV now.  2. NaHCO3 IV infusion to run at 100 mL/hour.  3. Increase PEEP to 16. 4. Repeat ABG at 8:30 AM.      Intervention Category Major Interventions: Acid-Base disturbance - evaluation and management;Respiratory failure - evaluation and management  Charma Mocarski Dennard Nip 12/08/2019, 5:52 AM

## 2019-12-08 NOTE — Consult Note (Signed)
Renal Service Consult Note [name]  Stuart Singh 12/08/2019 [name], MD Requesting Physician:  Dr [name], [name].   Reason for Consult:  Renal failure HPI: The patient is a 49 y.o. year-old w/ hx no PMH presented w/ sudden onset dyspnea. Breathing worsening throughout the day and pt presented to ED on 11/22/19. He was vaccinated x 2. In ED CXR showed multifocal pna, sats were in the 50's. Pt was admitted. Oxygenation worsened gradually and he required intubation on 10/25, 2 days ago.  Around the same time BP's dropped some, UOP dropped as well and now pt has rising creatinine up to 5.9 today, BUN 145.  Asked to see for renal failure.   Pt seen in ICU room.  On vent, sedated/ on paralytics, spO2 in the 80's. BP's 90's this am. ETT dislodged and had to be replaced.    Pt rec'd lasix for several days early in this hospital stay.  Wt's were 118 on admit, down to 101 lowest on 10/25 , now up to 111kg after fluid boluses the last 48hrs.   Pt had big spike in WBC and new temps on 10/25 when pt required intubation and BP's/ UOP dropped  NO CT contrast given remdesivir > 10/11- 10/15 actemra > 2/ 3rd hosp day Cefepime IV 10/25 - current  IV linezolid   10/25 - current  IV vancomycin  10/25 - current   Past Medical History  Past Medical History:  Diagnosis Date  . COVID-19 11/2019   Past Surgical History  Past Surgical History:  Procedure Laterality Date  . thumb surgery     Family History History reviewed. No pertinent family history. Social History  reports that he has never smoked. He has never used smokeless tobacco. He reports previous drug use. He reports that he does not drink alcohol. Allergies  Allergies  Allergen Reactions  . Codeine Rash  . Anacin-3 [Acetaminophen]     Tylenol 3   Home medications Prior to Admission medications   Not on File     Vitals:   12/08/19 1100 12/08/19 1222 12/08/19 1300 12/08/19 1409  BP: 104/63  (!) 99/46    Pulse: (!) 120 (!) 130 (!) 131   Resp: (!) 35 (!) 35 (!) 35   Temp: 100 F (37.8 C)  99.9 F (37.7 C)   TempSrc:      SpO2: (!) 79% (S) (!) 41% (!) 49% (!) 57%  Weight:      Height:       Exam Gen on vent, sedated and chemically paralyzed No rash, cyanosis or gangrene Sclera anicteric, throat w ETT  No jvd or bruits Chest clear bilat ant and lat RRR no MRG Abd soft ntnd no mass or ascites +bs GU normal male w/ foley cath MS no joint effusions or deformity Ext trace-1+ pretib edema, no wounds or ulcers Neuro as above    Home meds:  - none     Renal US 12/26 > 11-12 cm kidneys w/o hydro, normal echo    UNa 21, UCr 161 on 10/26    UA pending     Na 138  K 4.3  OC 24  BUN 145  CR 5.98  Assessment/ Plan: 1. Acute kidney injury - making urine, B/Cr 145/5.98 today.  Had spike in WBC/ fevers on 10/25 assoc w/ hypotension period/ intubation and drop in UOP. ATN vs hemodynamic ARF.  UOP better yest and today. May be recovering. Has room for volume, still 6-7 kg under  admit wts.  High BUN likely d/t catabolic state and steroids. Agree w/ IVF's at 100 cc/hr (bicarb gtt) for now. Check UA. Urine lytes suggest dry. Renal US shows no signs of obstruction, normal appearing kidneys. Will follow.  2. COVID pna - severe ARDS, unable to maintain sats > 90% for some time now 3. Resp acidosis - per CCM, getting bicarb gtt, w/ AKI renal compensation may be effective 4. HCAP / staph pna - getting cefepime and zyvox IV, dc IV vanc if possible w/ AKI      Stuart Moselle  MD 12/08/2019, 2:57 PM  Recent Labs  Lab 12/07/19 0403 12/08/19 0502  WBC 52.9* 36.0*  HGB 14.3 10.4*   Recent Labs  Lab 12/07/19 1053 12/07/19 1053 12/07/19 1800 12/08/19 0502  K 5.3*   < > 5.0 4.3  BUN 109*   < > 125* 145*  CREATININE 4.17*   < > 4.88* 5.98*  CALCIUM 7.4*   < > 7.1* 7.0*  PHOS 12.1*  --  13.5*  --    < > = values in this interval not displayed.

## 2019-12-08 NOTE — TOC Progression Note (Signed)
Transition of Care [facility]) - Progression Note    Patient Details  Name: Stuart Singh MRN: [id number] Date of Birth: 28-Oct-1970  [facility] CM/SW Contact  Golda Acre, RN Phone Number: 12/08/2019, 8:43 AM  Clinical Narrative:    Remains on the vent at 90-100% fi02, Iv solu medrol, Iv maxipime and zyvox, iv sedation, nahco3 drip, BUN-145, creat 5.98, wbc 36.0, hgb 10.4, Following for toc needs may need ltach placement or snf, following for progression.   Expected Discharge Plan: Home w Home Health Services Barriers to Discharge: Barriers Unresolved (comment)  Expected Discharge Plan and Services Expected Discharge Plan: Home w Home Health Services   Discharge Planning Services: CM Consult   Living arrangements for the past 2 months: Single Family Home                                       Social Determinants of Health (SDOH) Interventions    Readmission Risk Interventions No flowsheet data found.

## 2019-12-08 NOTE — Progress Notes (Signed)
CRITICAL VALUE ALERT  Critical Value: Results for Stuart Singh, Stuart Singh (MRN [id number]) as of 12/08/2019 05:36  Ref. Range 12/08/2019 04:54  Delivery systems Unknown VENTILATOR  FIO2 Unknown 100.00  Mode Unknown PRESSURE REGULATED VOLUME CONTROL  VT Latest Units: mL 410  Peep/cpap Latest Units: cm H20 14.0  pH, Arterial Latest Ref Range: 7.35 - 7.45  7.151 (LL)  pCO2 arterial Latest Ref Range: 32 - 48 mmHg 81.1 (HH)  pO2, Arterial Latest Ref Range: 83 - 108 mmHg 55.0 (L)  Acid-base deficit Latest Ref Range: 0.0 - 2.0 mmol/L 2.7 (H)  Bicarbonate Latest Ref Range: 20.0 - 28.0 mmol/L 26.7  O2 Saturation Latest Units: % 79.1  Patient temperature Unknown 100.2  Collection site Unknown RIGHT RADIAL  Allens test (pass/fail) Latest Ref Range: PASS  PASS    Date & Time Notied:  12/08/19 @ 0539  Provider Notified: Sheria Lang RN/ Dr. Lanier Ensign  Orders Received/Actions taken: awaiting;

## 2019-12-08 NOTE — Progress Notes (Signed)
Called to patient's bedside for low return volumes on ventilator and desaturation.  Felt to be blown cuff on ETT vs dislodgement of ETT.  On my arrival copious blood on oropharynx, suctioning in process.  Patient desaturated and became progressively bradycardic and hypotensive. 2 rounds of epinephrine and bicarb given, patient never lost a pulse.  Patient reintubated by Anders Simmonds NP. Saturations and HR recovered. Repeat chest xray ordered.   Additional critical care time 25 minutes.

## 2019-12-08 NOTE — Progress Notes (Signed)
Upon arrival to assess patient for 1200 check, bedside RN concerned with leak coming from around patient ETT cuff. Cuff checked and placement of ETT still in position. Vent alarms showed low return volumes and patient O2 sats falling. Patient removed from vent and manually ventilated. O2 sats continued to decline and audible leakage remained present; O2 at this time fell to 20-23% with PEEP valve maxed. MD and NP at bedside and ETT changed. Placement verified by color change, BBS, and CXR. Patient placed back on vent with previous settings in Epic. Patient O2 sats increased to 41%; patient slow to recruit. No new changes at this time. RT will continue to monitor patient.

## 2019-12-08 NOTE — Progress Notes (Signed)
NAME:  Stuart Singh, MRN:  [id number], DOB:  April 29, 1970, LOS: 16 ADMISSION DATE:  12/07/2019, CONSULTATION DATE:  10/11 REFERRING MD:  Dr. Ronaldo Miyamoto CHIEF COMPLAINT:  SOB, COVID PNA   Brief History   49 y/o M admitted with acute hypoxic respiratory failure with bilateral infiltrates in the setting of COVID PNA. Required heated HFNC, intermittent BiPAP.    Past Medical History  COVID  Significant Hospital Events   10/10 Admit with COVID PNA   10/12 On bipap 10/14 60L flow, 100% FiO2 10/18 70L flow, 100% FiO2 + NRB 10/19 70L flow, 100% fiO2 + NRB / unchanged. Asking for breakfast. Prone overnight. Did not wear BiPAP 10/20 70L flow, 100% fiO2, +NRB 10/21 On BiPAP, desaturation to 30's with change to heated high flow / recovered  10/22 Remains on BiPAP 100%, 5 PEEP 10/25 intubated.  Worsening renal failure.  Hyperkalemia.  Placed on ards protocol.  Required heavy sedation, neuromuscular blockade, and proning, small apical pneumothorax noted on right felt too small to decompress 10/26: Worsening gas exchange after placing back in supine position.  Renal failure worse.  Still hyperkalemic.  Now on bicarbonate infusion in neuromuscular blockade infusion.  Overall declining on all fronts.nephro consulted.  10/27 renal fxn worse. UOP still OK. Prerenal so getting fluid still. Still requiring max peep/fio2 Consults:     Nephrology 10/26>>> Procedures:  Endotracheal tube placed by anesthesia 10/25>>> Left IJ triple-lumen catheter placed 10/25>>>>  Significant Diagnostic Tests:  10/12 ECHO >> LVEF 60-65%, no RWMA, RV systolic function normal Renal US 10/26>>>neg  Micro Data:  COVID 10/10 >> positive  Influenza A/B 10/10 >> negative BCx2 10/10 >> negative  MRSA PCR 10/10 >> negative  Respiratory culture 10/25: staph >>> Antimicrobials/COVID RX  Tociluzimab X 2- 10/11, 10/12  Remdesivir 10/12 >> 10/14 Solumedrol 10/12 >> dose reduced to 40 mg every 12 on 10/26>>> Vancomycin  10/25>>>10/26 zyvox 10/26>>> Cefepime 10/25>>>  Interim history/subjective:  Marland Kitchen  Objective   Blood pressure 120/67, pulse (Abnormal) 123, temperature 100.2 F (37.9 C), resp. rate (Abnormal) 35, height 5\' 8"  (1.727 m), weight 111 kg, SpO2 (Abnormal) 84 %. CVP:  [3 mmHg-11 mmHg] 6 mmHg  Vent Mode: PRVC FiO2 (%):  [90 %-100 %] 100 % Set Rate:  [35 bmp] 35 bmp Vt Set:  [410 mL] 410 mL PEEP:  [12 cmH20-16 cmH20] 16 cmH20 Plateau Pressure:  [27 cmH20-30 cmH20] 27 cmH20   Intake/Output Summary (Last 24 hours) at 12/08/2019 0852 Last data filed at 12/08/2019 0700 Gross per 24 hour  Intake 5130.36 ml  Output 1625 ml  Net 3505.36 ml   Filed Weights   12/06/19 1600 12/07/19 0439 12/08/19 0500  Weight: 101.1 kg 104 kg 111 kg    Examination:  General this is a 49 year old critically ill male. He is on high peep and fio2 and has had worsening overall MOF  HENT bilateral rhinorockets in place. Left IJ dressing w/ dried blood. Orally intubated pulm decreased bilaterally. VT 410 RR 35 peep 16 fio2 100 pplat 34  Card rrr abd soft + bowel sounds Ext warm and dry marked ecchymosis left shoulder  Neuro paralyzed on NMB sedated  GU cl yellow    Resolved Hospital Problem list      Assessment & Plan:   Acute Hypoxic Respiratory Failure in setting of Diffuse Bilateral Infiltrates/ARDS due to COVID PNA Progressive acute respiratory acidosis (10/26) -Completed remdesivir, recieved Actemra 10/11, 10/12.   -Portable chest x-ray personally reviewed: ett and CVL good position. Diffuse &  bilateral airspace disease worse.  -worsening hypercarbia/hypoxia Plan Cont low Vt ventilation Goal pao2 55-65 Keep supine-->does not tolerate prone pplat goal <30/driving pressure goal < 15 (not able to achieve this) Cont solumedrol at 40 q 12 and slowly taper (has not helped) Cont sedation and NMB (as below) VAP bundle Treat staph PNA Allow for permissive hypercarbia Serial abg cxr am    Right  sided PTX (about 5% as of 10/25)-->now resolved Portable chest x-ray personally reviewed: no PTX Plan Am cxr and monitor closely    Acute metabolic encephalopathy with agitated delirium Now heavily sedated and receiving intermittent neuromuscular blockade Plan RASS goal -4 to -5 fent and versed infusion NMB infusion  Staph Pneumonia (sensitivities pending) Plan abx day 3 of cefepime Day 2 zyvox  Await additional cultures and narrow as indicated.   Acute kidney injury w/ anion gap metabolic acidosis  Renal US was neg FENa: 0.39 c/w prerenal  CVP: 7 Urine output: ok Scr worse now up to 5.98/BUN 145 Plan Ck lactate Repeat IV bolus Await nephology consult Renal dose meds Strict I&O Will likely need CRRT  Elevated D-Dimer, Rule Out PE D-dimer increased to > 20.  LE duplex and ECHO negative.  -unable to get CTA due to respiratory status   Plan SCDs today given bleeding [place] heparin in am 10/28  ABLA (hgb from 14.3 to 10.4) Bleeding from central line and nasal cavity->Rhino rocket placed 10/26; but also some of this is dilution effect.  Platelets within normal->this was likely due to supra therapeutic LMWH.   Plan SCDs for another 24 hrs Remove Rhino rocket 10/28 Trend cbc  Transfuse for hgb < 7   hyperglycemia: w/in glycemic control goals Plan Cont resistant ssi Cont lantus at 10 units q 12 Glucose goal 140-180    Best practice:  Diet: Regular, as tolerated  Pain/Anxiety/Delirium protocol (if indicated): n/a  VAP protocol (if indicated): n/a  DVT prophylaxis: Lovenox  GI prophylaxis: n/a Glucose control: per primary  Mobility: as tolerated Code Status: Full Code.  Confirmed 10/20 that if necessary, he would want intubation.  Family Communication: Per primary.  Called sister [name]") for update 10/20 Disposition: ICU   My critical care time is 40 minutes  Simonne Martinet ACNP-BC [facility] Pulmonary/Critical Care Pager # [phone] OR # [phone] if no  answer

## 2019-12-08 NOTE — Progress Notes (Signed)
I spoke at length w/ Kabir's sister. She has expressed her "faith is strong" she is convinced that he will survive and that a miracle is going to happen. I told her I think that he will likely die as a consequence of this infection and that even with CRRT my experience with this in the setting of COVID and Multiple organ failure is that is rarely fixes anything and people still die no matter what invasive interventions we offer. In this context I asked her if she had spoken to her family about what to do if he arrests and if her were to arrest that there would be nothing to offer. She was very polite but told me the story of her other brother "who died on the operating table but still made it in spite of what the doctors told her". She is strong in her faith and said she will not even talk about the "what ifs" and that we need to do everything we can do humanly possible.  At this point  Plan Full code w/ all possible interventions Cont care as outlined  Simonne Martinet ACNP-BC [facility] Pulmonary/Critical Care Pager # [phone] OR # [phone] if no answer

## 2019-12-09 ENCOUNTER — Inpatient Hospital Stay (HOSPITAL_COMMUNITY): Payer: BC Managed Care – PPO

## 2019-12-09 DIAGNOSIS — G9341 Metabolic encephalopathy: Secondary | ICD-10-CM | POA: Diagnosis not present

## 2019-12-09 DIAGNOSIS — U071 COVID-19: Secondary | ICD-10-CM | POA: Diagnosis not present

## 2019-12-09 DIAGNOSIS — N170 Acute kidney failure with tubular necrosis: Secondary | ICD-10-CM | POA: Diagnosis not present

## 2019-12-09 DIAGNOSIS — J1282 Pneumonia due to coronavirus disease 2019: Secondary | ICD-10-CM | POA: Diagnosis not present

## 2019-12-09 LAB — BASIC METABOLIC PANEL
Anion gap: 20 — ABNORMAL HIGH (ref 5–15)
BUN: 186 mg/dL — ABNORMAL HIGH (ref 6–20)
CO2: 32 mmol/L (ref 22–32)
Calcium: 6.5 mg/dL — ABNORMAL LOW (ref 8.9–10.3)
Chloride: 88 mmol/L — ABNORMAL LOW (ref 98–111)
Creatinine, Ser: 8.77 mg/dL — ABNORMAL HIGH (ref 0.61–1.24)
GFR, Estimated: 7 mL/min — ABNORMAL LOW (ref >60)
Glucose, Bld: 172 mg/dL — ABNORMAL HIGH (ref 70–99)
Potassium: 4.6 mmol/L (ref 3.5–5.1)
Sodium: 140 mmol/L (ref 135–145)

## 2019-12-09 LAB — CULTURE, RESPIRATORY W GRAM STAIN

## 2019-12-09 LAB — CBC
HCT: 23.8 % — ABNORMAL LOW (ref 39.0–52.0)
Hemoglobin: 7.9 g/dL — ABNORMAL LOW (ref 13.0–17.0)
MCH: 31.9 pg (ref 26.0–34.0)
MCHC: 33.2 g/dL (ref 30.0–36.0)
MCV: 96 fL (ref 80.0–100.0)
Platelets: 100 10*3/uL — ABNORMAL LOW (ref 150–400)
RBC: 2.48 MIL/uL — ABNORMAL LOW (ref 4.22–5.81)
RDW: 15.3 % (ref 11.5–15.5)
WBC: 32.6 10*3/uL — ABNORMAL HIGH (ref 4.0–10.5)
nRBC: 1.8 % — ABNORMAL HIGH (ref 0.0–0.2)

## 2019-12-09 LAB — GLUCOSE, CAPILLARY
Glucose-Capillary: 137 mg/dL — ABNORMAL HIGH (ref 70–99)
Glucose-Capillary: 152 mg/dL — ABNORMAL HIGH (ref 70–99)
Glucose-Capillary: 163 mg/dL — ABNORMAL HIGH (ref 70–99)
Glucose-Capillary: 189 mg/dL — ABNORMAL HIGH (ref 70–99)

## 2019-12-09 MED ORDER — SODIUM CHLORIDE 0.9% FLUSH: 10.0000 mL | Freq: Two times a day (BID) | INTRAVENOUS | Status: DC

## 2019-12-09 MED ORDER — MORPHINE 100MG IN NS 100ML (1MG/ML) PREMIX INFUSION
1.0000 mg/h | INTRAVENOUS | Status: DC
  Filled 2019-12-09: qty 100

## 2019-12-09 MED ORDER — SODIUM CHLORIDE 0.9% FLUSH: 10.0000 mL | INTRAVENOUS | Status: DC | PRN

## 2019-12-09 MED ORDER — INSULIN GLARGINE 100 UNIT/ML ~~LOC~~ SOLN
12.0000 [IU] | Freq: Two times a day (BID) | SUBCUTANEOUS | Status: DC
  Filled 2019-12-09: qty 0.12

## 2019-12-09 MED FILL — Medication: Qty: 1 | Status: AC

## 2019-12-10 ENCOUNTER — Ambulatory Visit (HOSPITAL_COMMUNITY): Payer: BC Managed Care – PPO

## 2019-12-12 LAB — CULTURE, BLOOD (ROUTINE X 2)
Culture: NO GROWTH
Culture: NO GROWTH
Special Requests: ADEQUATE
Special Requests: ADEQUATE

## 2019-12-13 NOTE — Progress Notes (Addendum)
Wasted 100 mL of Fentanyl  with Angelique Holm RN

## 2019-12-13 NOTE — Progress Notes (Signed)
Time of Death: 18:18. No heartbeat auscultated for 2 minutes by this RN and Cathe Mons RN, patients daughter Ephriam Knuckles and pts sister Vane Yapp present at time of death.

## 2019-12-13 NOTE — Progress Notes (Signed)
Chaplain established relationship of care and concern.  Provided emotional support to extended family members. Chaplain present with daughter and sisters at the time of patient's death.  Chaplain escorted family members to elevator when they were ready to depart.  Chaplain offered staff support to attending nurse after all the family was gone.  Vernell Morgans Staff Chaplain Vernell Morgans

## 2019-12-13 NOTE — Progress Notes (Signed)
Sent patient's belonging with him to the morgue, that included a red duffle bag, cell phone, small tablet, and clothing.

## 2019-12-13 NOTE — Progress Notes (Signed)
Chaplain providing support at bedside and in GOC with this extended family.   Providing spiritual and emotional support around grief, values that influence care decisions.

## 2019-12-13 NOTE — Progress Notes (Signed)
NAME:  Stuart Singh, MRN:  [id number], DOB:  1970/10/19, LOS: 17 ADMISSION DATE:  12/05/2019, CONSULTATION DATE:  10/11 REFERRING MD:  Dr. Ronaldo Miyamoto CHIEF COMPLAINT:  SOB, COVID PNA   Brief History   49 y/o M admitted with acute hypoxic respiratory failure with bilateral infiltrates in the setting of COVID PNA. Required heated HFNC, intermittent BiPAP.    Past Medical History  COVID  Significant Hospital Events   10/10 Admit with COVID PNA   10/12 On bipap 10/14 60L flow, 100% FiO2 10/18 70L flow, 100% FiO2 + NRB 10/19 70L flow, 100% fiO2 + NRB / unchanged. Asking for breakfast. Prone overnight. Did not wear BiPAP 10/20 70L flow, 100% fiO2, +NRB 10/21 On BiPAP, desaturation to 30's with change to heated high flow / recovered  10/22 Remains on BiPAP 100%, 5 PEEP 10/25 intubated.  Worsening renal failure.  Hyperkalemia.  Placed on ards protocol.  Required heavy sedation, neuromuscular blockade, and proning, small apical pneumothorax noted on right felt too small to decompress 10/26: Worsening gas exchange after placing back in supine position.  Renal failure worse.  Still hyperkalemic.  Now on bicarbonate infusion in neuromuscular blockade infusion.  Overall declining on all fronts.nephro consulted.  10/27 renal fxn worse. UOP still OK. Prerenal so getting fluid still. Still requiring max peep/fio2, tube got dislodged. Required emergent re-intubation. Pulse ox 50-60s since. Seen by renal trying hydration and Bicarb. Family still wanting all medical therapies and not ready to discuss code status  10/28 sats still in 60s. UOP dropping a little. ABX changed to ancef on 27th w/ sputum growing MSSA->renal fxn worse Consults:     Nephrology 10/26>>> Procedures:  Endotracheal tube placed by anesthesia 10/25>>> Left IJ triple-lumen catheter placed 10/25>>>>  Significant Diagnostic Tests:  10/12 ECHO >> LVEF 60-65%, no RWMA, RV systolic function normal Renal US 10/26>>>neg  Micro Data:  COVID  10/10 >> positive  Influenza A/B 10/10 >> negative BCx2 10/10 >> negative  MRSA PCR 10/10 >> negative  Respiratory culture 10/25: MSSA Antimicrobials/COVID RX  Tociluzimab X 2- 10/11, 10/12  Remdesivir 10/12 >> 10/14 Solumedrol 10/12 >> dose reduced to 40 mg every 12 on 10/26>>> Vancomycin 10/25>>>10/26 zyvox 10/26>>>10/27 Cefazolin 10/27>>> Cefepime 10/25>>>10/28  Interim history/subjective:  Marland Kitchen Remains hypoxic.  Now on low-dose norepinephrine Objective   Blood pressure 117/64, pulse (Abnormal) 110, temperature 99 F (37.2 C), resp. rate (Abnormal) 35, height 5\' 8"  (1.727 m), weight 111 kg, SpO2 (Abnormal) 70 %.    Vent Mode: PRVC FiO2 (%):  [100 %] 100 % Set Rate:  [35 bmp] 35 bmp Vt Set:  [410 mL] 410 mL PEEP:  [16 cmH20] 16 cmH20 Plateau Pressure:  [19 cmH20-37 cmH20] 32 cmH20   Intake/Output Summary (Last 24 hours) at 01/02/2020 0945 Last data filed at Jan 02, 2020 0600 Gross per 24 hour  Intake 3988.35 ml  Output 335 ml  Net 3653.35 ml   Filed Weights   12/06/19 1600 12/07/19 0439 12/08/19 0500  Weight: 101.1 kg 104 kg 111 kg    Examination: General this is a 49 year old male patient who remains sedated and paralyzed on neuromuscular blockade HEENT normocephalic atraumatic the left IJ triple-lumen catheter dressing is intact he is orally intubated he does now have some scleral edema pupils remain equal and reactive Pulmonary: Diminished bilaterally currently on full ventilatory support PEEP 16, FiO2 100%, respiratory rate 35 tidal volume 410.  Plateau pressures on the settings remain at 34 Cardiac: Regular rate and rhythm Abdomen: Soft not tender tolerating tube feeds  Extremities: Worsening diffuse anasarca Neuro: Sedated and paralyzed   Resolved Hospital Problem list      Assessment & Plan:   Acute Hypoxic Respiratory Failure in setting of Diffuse Bilateral Infiltrates/ARDS due to COVID PNA Progressive acute respiratory acidosis (10/26) -Completed  remdesivir, recieved Actemra 10/11, 10/12.   Portable chest x-ray personally reviewed this demonstrates worsening bilateral airspace disease, unfortunately he has become progressively hypoxic in spite of all therapies, and is intolerant of prone position hemodynamically.  We have really nothing else to offer at this point Plan Continue low tidal volume ventilation Goal PaO2 55-65 Discontinued proning position given intolerance P plat pressure less than 30 with driving pressure goal less than 15 however we have been unable to achieve this Decrease Solu-Medrol to 40 mg daily  No change in sedation or neuromuscular blockade regimen RASS goal -5  A.m. chest x-ray  Supportive care  Excepting hypercarbia  Treating pneumonia   Right sided PTX (about 5% as of 10/25)-->now resolved Portable chest x-ray personally reviewed: no PTX Plan A.m. chest x-ray   Acute metabolic encephalopathy with agitated delirium Now heavily sedated and receiving intermittent neuromuscular blockade Plan RASS goal -4 to -5 Continuing fentanyl, Versed, and neuromuscular blockade infusion  Staph Pneumonia (MSSA) Plan Antibiotic day #4, switched to Ancef on 10/27, will complete a 7-day course  Trend CBC and fever curve  Acute kidney injury w/ anion gap metabolic acidosis  Seen by nephrology.  Bicarbonate infusion started on 10/27.  Acid-base stable, potassium stable, serum creatinine and BUN climbing Plan Continue bicarbonate infusion Renal dose medications Serial chemistries Will likely eventually need CRRT but doubtful that this will change the course of this illness  Drug-related hypotension Plan Continue norepinephrine for mean arterial pressure goal greater than 65 Continue telemetry monitoring  Elevated D-Dimer, Rule Out PE D-dimer increased to > 20.  LE duplex and ECHO negative.  -unable to get CTA due to respiratory status   Plan Continue SCDs  ABLA (hgb from 14.3 to 10.4) Bleeding from central  line and nasal cavity->Rhino rocket placed 10/26; but also some of this is dilution effect.  Platelets within normal->this was likely due to supra therapeutic LMWH.  His hemoglobin has dropped over 2 g in the last 24 hours, has been off heparin now for about 48 hours, there is really no evidence of active bleeding at this point on 10/28 Plan Keep SCDs, will hold off on resuming low molecular weight heparin or subcu heparin Remove Rhino Rocket's today Serial CBCs Conservative strategy for transfusion  hyperglycemia: w/in glycemic control goals Plan Continue sliding scale insulin, and basal dosing, can increase Lantus to 12 units twice daily, continue tube feed coverage at 5 units and continue resistance sliding scale   Best practice:  Diet: 10/25 tubefeed Pain/Anxiety/Delirium protocol 10/25 VAP protocol (if indicated): 10/25 DVT prophylaxis: Lovenox, placed on hold 10/26 due to severe epistaxis GI prophylaxis: 10/26 Glucose control: Sliding scale, Lantus, and tube feed coverage Mobility: as tolerated Code Status: Full Code.  Last goals of care discussion with carried out on 10/27 family continues to desire aggressive care including full CODE STATUS in spite of continued and daily decline  family Communication: Per primary.  Called sister [name]") for update 10/27 and 28 Disposition: ICU   My critical care time is 35 minutes Simonne Martinet ACNP-BC [facility] Pulmonary/Critical Care Pager # [phone] OR # [phone] if no answer

## 2019-12-13 NOTE — Progress Notes (Signed)
Pt declining over the last hour. sats down to 19% No other changes in vent mechanics. We got stat CXR. No sig changes.  Family contacted. Made DNR family on way   Simonne Martinet ACNP-BC [facility] Pulmonary/Critical Care Pager # [phone] OR # [phone] if no answer

## 2019-12-13 NOTE — ED Provider Notes (Signed)
I was called to bedside in the ICU for this patient in the setting of profound hypoxia.  Admitted and intubated with COVID-19 and ARDS.  Oxygen saturations dropping into the low 20s, concerning hemodynamics, concern for imminent cardiac arrest.  Shortly after my arrival, the ICU team was able to reach family and confirm new goals of care wishes, patient is now DNR.  Based on my discussions with the ICU team, his medical management has been maximized and prognosis is quite poor.  Stuart Singh. Stuart Plate, MD [facility] Emergency Medicine [facility] [email or web address]    Sabas Sous, MD 2019-12-31 [phone]

## 2019-12-13 NOTE — Progress Notes (Signed)
eLink Physician-Brief Progress Note Patient Name: Isais Klipfel DOB: 03-06-1970 MRN: [id number]   Date of Service  12/19/19  HPI/Events of Note  Hypoxia - Sat = 58%.  eICU Interventions  Plan: 1. Portable CXR STAT.     Intervention Category Major Interventions: Hypoxemia - evaluation and management  Emmalise Huard [name] 2019/12/19, 6:30 AM

## 2019-12-13 DEATH — deceased

## 2020-01-12 NOTE — Discharge Summary (Signed)
DEATH SUMMARY   Patient Details  Name: Stuart Singh MRN: [id number] DOB: February 04, 1971  Admission/Discharge Information   Admit Date:  11-22-19  Date of Death: Date of Death: 2019/12/10  Time of Death: Time of Death: May 28, 1816  Length of Stay: 05-30-22  Referring Physician: Patient, No Pcp Per   Reason(s) for Hospitalization  COVID 19 infection  Diagnoses  Preliminary cause of death: COVID 19 Pneumonia Secondary Diagnoses (including complications and co-morbidities):  Principal Problem:   Pneumonia due to COVID-19 virus Active Problems:   Acute respiratory failure with hypoxia (HCC)   Steroid-induced hyperglycemia   Obesity, Class II, BMI 35-39.9   Acute metabolic encephalopathy   Oral thrush   Pulmonary embolism (HCC)   Pressure injury of skin   Acute renal failure (HCC)   Acute respiratory distress syndrome (ARDS) due to COVID-19 virus [facility])   Brief Hospital Course (including significant findings, care, treatment, and services provided and events leading to death)  Stuart Singh is a 49 y.o. year old male who was admitted with acute hypoxic respiratory failure with bilateral infiltrates in the setting of COVID Pneumonia on Nov 22, 2019.  He was subsequently started on high flow oxygen and doing self- prone positioning for several days. Treated with steroids, remdesevir, tocilizumab. Also treated for superimposed bacterial infection.  Required intubation on 10/25. Required heavy sedation, pron positioning, neuromuscular blockade. Developed small pneumothorax.  Developed on 10/26 worsening renal failure which progressed.  Despite maximum ventilator settings patient became profoundly hypoxemic. Family was notified and patient was made DNR.  Transitioned to comfort measures on their arrival. Family at bedside.  Pertinent Labs and Studies  Significant Diagnostic Studies DG Chest 1 View  Result Date: 12/06/2019 CLINICAL DATA:  Dyspnea EXAM: CHEST  1 VIEW COMPARISON:  Three days ago FINDINGS:  Stable interstitial and airspace opacity at the lung bases. New pneumomediastinum seen tracking into the neck. No convincing pneumothorax, although there is a subtle lucency over the lateral right chest. Normal heart size. These results will be called to the ordering clinician or representative by the Radiologist Assistant, and communication documented in the PACS or [employer]. IMPRESSION: 1. New pneumomediastinum. No definite pneumothorax but suggest follow-up later today to re-evaluate a subtle lucency on the right. 2. Stable infiltrates. Electronically Signed   By: Marnee Spring M.D.   On: 12/06/2019 05:27   DG Chest 1 View  Result Date: 12/02/2019 CLINICAL DATA:  Shortness of breath, COVID-19 positive EXAM: CHEST  1 VIEW COMPARISON:  11/30/2019 FINDINGS: Stable cardiomediastinal contours. Persistent but improved bilateral airspace opacities, most confluent within the right lung base. Aeration of the lung fields has improved from prior. No discernible pneumothorax. IMPRESSION: Persistent but improved bilateral airspace opacities, most confluent within the right lung base. Electronically Signed   By: Duanne Guess D.O.   On: 12/02/2019 09:51   DG Abd 1 View  Result Date: 12/06/2019 CLINICAL DATA:  Feeding tube placement. EXAM: ABDOMEN - 1 VIEW COMPARISON:  None. FINDINGS: The bowel gas pattern is normal. Feeding tube is looped within the stomach, with the distal tip in the proximal stomach. No radio-opaque calculi or other significant radiographic abnormality are seen. IMPRESSION: Feeding tube is looped within the stomach, with distal tip in the proximal stomach. Electronically Signed   By: Lupita Raider M.D.   On: 12/06/2019 13:59   US RENAL  Result Date: 12/07/2019 CLINICAL DATA:  Acute renal failure EXAM: RENAL / URINARY TRACT ULTRASOUND COMPLETE COMPARISON:  None. FINDINGS: Right Kidney: Renal measurements: 1.9 x 5.7  x 5.9 cm = volume: 207 mL. Echogenicity within normal limits. No  mass or hydronephrosis visualized. Small volume perinephric fluid. Left Kidney: Renal measurements: 12.7 x 6.2 by 5.1 cm = volume: 209 mL. Echogenicity within normal limits. Anechoic cyst within the upper pole, measuring 2.2 x 2.0 x 2.1 cm. Small volume perinephric fluid. Bladder: Not visible. IMPRESSION: 1. No hydronephrosis. 2. Small volume nonspecific perinephric fluid bilaterally. 3. Approximately 2.2 cm left renal cyst. Electronically Signed   By: Feliberto Harts MD   On: 12/07/2019 15:41   DG CHEST PORT 1 VIEW  Result Date: 11/27/2019 CLINICAL DATA:  Hypoxia EXAM: PORTABLE CHEST 1 VIEW COMPARISON:  December 09, 2019 study obtained earlier in the day. FINDINGS: Endotracheal tube tip is 4.9 cm above the carina. Feeding tube tip is in the gastric fundus region, stable. Central catheter tip is in the superior vena cava. No pneumothorax. There is airspace opacity throughout the lungs bilaterally, stable, with small right pleural effusion. There is stable borderline cardiac prominence. No adenopathy. No bone lesions. IMPRESSION: Tube and catheter positions as described without pneumothorax. Multifocal airspace opacity persists without appreciable change. Small right pleural effusion. Stable cardiac silhouette. Electronically Signed   By: Bretta Bang III M.D.   On: 12/11/2019 14:22   DG CHEST PORT 1 VIEW  Result Date: 12/02/2019 CLINICAL DATA:  Hypoxia.  Ventilator dependence. EXAM: PORTABLE CHEST 1 VIEW COMPARISON:  12/08/2019 FINDINGS: 0644 hours. Endotracheal tube tip is 4.8 cm above the base of the carina. Feeding tube tip is in the gastric fundus. Left IJ central line tip overlies the lower SVC level. Diffuse bilateral airspace disease again noted. Cardiopericardial silhouette is at upper limits of normal for size. Telemetry leads overlie the chest. IMPRESSION: 1. Stable exam. 2. Diffuse bilateral airspace disease. Electronically Signed   By: Kennith Center M.D.   On: 11/17/2019 07:27   DG  Chest Port 1 View  Result Date: 12/08/2019 CLINICAL DATA:  Respiratory failure, COVID-19 EXAM: PORTABLE CHEST 1 VIEW COMPARISON:  Portable exam 1211 hours compared to 0501 hours FINDINGS: Tip of endotracheal tube is located within the proximal RIGHT mainstem bronchus, recommend withdrawal 2.5 cm. Feeding tube extends into stomach, tip at fundus. External pacing leads. LEFT jugular line, tip projecting over SVC. Upper normal heart size with normal mediastinal contours. Diffuse BILATERAL pulmonary infiltrates consistent with multifocal pneumonia and history of COVID-19. No pleural effusion or pneumothorax. IMPRESSION: Persistent pulmonary infiltrates consistent with COVID-19 pneumonia. Recommend withdrawal of endotracheal tube 2.5 cm. Findings called Drema Pry in ICU on 12/08/2019 at 1245 hrs. Electronically Signed   By: Ulyses Southward M.D.   On: 12/08/2019 12:45   DG CHEST PORT 1 VIEW  Result Date: 12/08/2019 CLINICAL DATA:  Hypoxia.  COVID-19 positive. EXAM: PORTABLE CHEST 1 VIEW COMPARISON:  December 07, 2019 FINDINGS: Endotracheal tube tip is 4.9 cm above the carina. Central catheter tip is at the cavoatrial junction. Enteric tube tip is in the stomach. No pneumothorax. There is patchy airspace opacity throughout the mid and lower lung regions, essentially stable. No new opacity evident. Heart size and pulmonary vascularity are normal. No adenopathy. No bone lesions. IMPRESSION: Tube and catheter positions as described without evident pneumothorax. Multifocal airspace opacity persists in the mid and lower lung regions consistent with atypical organism pneumonia. No new opacity evident. Stable cardiac silhouette. Electronically Signed   By: Bretta Bang III M.D.   On: 12/08/2019 07:57   DG CHEST PORT 1 VIEW  Result Date: 12/07/2019 CLINICAL DATA:  COVID hypoxia EXAM: PORTABLE CHEST 1 VIEW COMPARISON:  12/07/2019, 12/06/2019, 12/03/2019 FINDINGS: Endotracheal tube tip is about 3.4 cm superior  to the carina. Esophageal tube tip beneath the left diaphragm, small amount of contrast is visible around the tip. Left IJ central venous catheter tip over the SVC. No significant change in moderate ground-glass opacity and consolidations consistent with pneumonia. Stable cardiomediastinal silhouette. No pneumothorax. IMPRESSION: 1. No significant interval change in bilateral ground-glass and airspace disease consistent with pneumonia. 2. Support lines and tubes as above. Electronically Signed   By: Jasmine Pang M.D.   On: 12/07/2019 22:47   DG Chest Port 1 View  Result Date: 12/07/2019 CLINICAL DATA:  COVID positive.  Follow-up pneumonia. EXAM: PORTABLE CHEST 1 VIEW COMPARISON:  12/06/2019 FINDINGS: Endotracheal tube in good position. Feeding tube coiled in the stomach with the tip in the fundus of the stomach. Left jugular central venous catheter tip in the SVC. Resolution of right-sided pneumothorax. Diffuse bilateral airspace disease is basilar predominant. Mild improvement in the right sided infiltrate. Mild progression on the left. IMPRESSION: Resolution of right pneumothorax Bibasilar airspace disease with mild progression on the left. Improvement in right lower lobe airspace disease. Electronically Signed   By: Marlan Palau M.D.   On: 12/07/2019 09:53   DG Chest Port 1 View  Result Date: 12/06/2019 CLINICAL DATA:  Evaluate central venous catheter placement. EXAM: PORTABLE CHEST 1 VIEW COMPARISON:  Earlier today FINDINGS: Left IJ catheter tip is at the level of the cavoatrial. Small right-sided pneumothorax is identified new from previous exam. This measures approximately 1.3 cm measured over the right apex. ET tube tip is above the carina. There is a feeding tube with tip below the GE junction looped in the proximal stomach. Stable mild cardiac enlargement. Bilateral lower lobe airspace and interstitial opacities are unchanged. IMPRESSION: 1. New small right-sided pneumothorax. 2. New left IJ  catheter with tip at the level of the cavoatrial junction. 3. No change in aeration to the lungs compared with previous exam. 4. Critical Value/emergent results were called by telephone at the time of interpretation on 12/06/2019 at 1:21 pm to provider Nurse Myna Bright, who verbally acknowledged these results. Electronically Signed   By: Signa Kell M.D.   On: 12/06/2019 13:22   DG CHEST PORT 1 VIEW  Result Date: 12/06/2019 CLINICAL DATA:  Intubation EXAM: PORTABLE CHEST 1 VIEW COMPARISON:  Earlier today FINDINGS: New endotracheal tube with tip halfway between the clavicular heads and carina. Stable pulmonary infiltrates. No visible pneumothorax. Soft tissue gas at the right neck base is non progressed. Normal heart size. IMPRESSION: 1. New endotracheal tube in good position. 2. Stable soft tissue emphysema in the neck. No visible pneumothorax. Electronically Signed   By: Marnee Spring M.D.   On: 12/06/2019 07:15   DG CHEST PORT 1 VIEW  Result Date: 12/03/2019 CLINICAL DATA:  Hypoxia EXAM: PORTABLE CHEST 1 VIEW COMPARISON:  12/02/2019 FINDINGS: Cardiac enlargement. Bilateral perihilar and basilar infiltrates could represent edema or pneumonia. No pleural effusions. No pneumothorax. Mediastinal contours appear intact. IMPRESSION: Cardiac enlargement with bilateral perihilar and basilar infiltrates. Electronically Signed   By: Burman Nieves M.D.   On: 12/03/2019 06:10   DG CHEST PORT 1 VIEW  Result Date: 11/30/2019 CLINICAL DATA:  Respiratory failure.  COVID. EXAM: PORTABLE CHEST 1 VIEW COMPARISON:  Four days ago FINDINGS: Artifact from EKG leads. Confluent airspace disease with stable lung volumes. Normal heart size for technique. No visible effusion or air leak. IMPRESSION: Confluent airspace  disease with stable inflation. Electronically Signed   By: Marnee Spring M.D.   On: 11/30/2019 05:52   DG CHEST PORT 1 VIEW  Result Date: 11/26/2019 CLINICAL DATA:  Acute respiratory failure with  hypoxia. Patient in prone position. EXAM: PORTABLE CHEST 1 VIEW COMPARISON:  One-view chest x-ray 11/25/2019 FINDINGS: Heart size is normal. Increased interstitial and airspace opacities noted, right greater than left. Right-sided effusion is present. IMPRESSION: 1. Increased interstitial and airspace disease, right greater than left. Findings are compatible with pneumonia. 2. Right-sided effusion. Electronically Signed   By: Marin Roberts M.D.   On: 11/26/2019 06:57   DG CHEST PORT 1 VIEW  Result Date: 11/25/2019 CLINICAL DATA:  Respiratory failure.  COVID infection. EXAM: PORTABLE CHEST 1 VIEW COMPARISON:  11/23/2019 FINDINGS: 0433 hours. Low volumes with mild asymmetric elevation of the right hemidiaphragm. The cardio pericardial silhouette is enlarged. Diffuse bilateral airspace disease again noted without substantial interval change. The visualized bony structures of the thorax show no acute abnormality. Telemetry leads overlie the chest. IMPRESSION: No substantial interval change in bilateral patchy airspace disease. Electronically Signed   By: Kennith Center M.D.   On: 11/25/2019 07:37   DG CHEST PORT 1 VIEW  Result Date: 11/23/2019 CLINICAL DATA:  Shortness of breath.  COVID-19 positive EXAM: PORTABLE CHEST 1 VIEW COMPARISON:  November 21, 2019 FINDINGS: There is patchy airspace opacity in the lungs bilaterally with a slight degree of clearing bilaterally compared to 2 days prior. No new opacity evident. Heart is mildly enlarged with pulmonary vascularity normal. No adenopathy. No bone lesions. IMPRESSION: Multifocal airspace opacity consistent with atypical organism pneumonia. Slight clearing bilaterally compared to recent study. No new opacity evident. Mild cardiomegaly. No adenopathy. Electronically Signed   By: Bretta Bang III M.D.   On: 11/23/2019 08:59   DG Chest Port 1 View  Result Date: 12/04/2019 CLINICAL DATA:  49 year old male with shortness of breath. EXAM: PORTABLE  CHEST 1 VIEW COMPARISON:  None. FINDINGS: Bilateral confluent airspace opacities consistent with multifocal pneumonia likely viral or atypical in etiology including COVID-19. Clinical correlation is recommended. There is no pleural effusion pneumothorax. Top-normal cardiac silhouette. No acute osseous pathology. IMPRESSION: Multifocal pneumonia. Electronically Signed   By: Elgie Collard M.D.   On: 11/24/2019 21:02   ECHOCARDIOGRAM COMPLETE  Result Date: 11/23/2019    ECHOCARDIOGRAM REPORT   Patient Name:   Stuart Singh Date of Exam: 11/23/2019 Medical Rec #:  [id number]     Height:       68.0 in Accession #:    [id number]    Weight:       260.1 lb Date of Birth:  [birth year]     BSA:          2.285 m Patient Age:    49 years      BP:           119/59 mmHg Patient Gender: M             HR:           80 bpm. Exam Location:  Inpatient Procedure: 2D Echo Indications:    Cardiomegaly 429.3 / I51.7  History:        Patient has no prior history of Echocardiogram examinations.                 COVID 19.  Sonographer:    Leta Jungling RDCS Referring Phys: [id number] [facility] M PATEL IMPRESSIONS  1. Left ventricular ejection fraction, by estimation, is 60 to  65%. The left ventricle has normal function. The left ventricle has no regional wall motion abnormalities. Left ventricular diastolic parameters were normal.  2. Right ventricular systolic function is normal. The right ventricular size is normal.  3. The mitral valve is normal in structure. No evidence of mitral valve regurgitation. No evidence of mitral stenosis.  4. The aortic valve is normal in structure. Aortic valve regurgitation is not visualized. No aortic stenosis is present.  5. The inferior vena cava is normal in size with greater than 50% respiratory variability, suggesting right atrial pressure of 3 mmHg. FINDINGS  Left Ventricle: Left ventricular ejection fraction, by estimation, is 60 to 65%. The left ventricle has normal function. The left ventricle has  no regional wall motion abnormalities. The left ventricular internal cavity size was normal in size. There is  no left ventricular hypertrophy. Left ventricular diastolic parameters were normal. Normal left ventricular filling pressure. Right Ventricle: The right ventricular size is normal. No increase in right ventricular wall thickness. Right ventricular systolic function is normal. Left Atrium: Left atrial size was normal in size. Right Atrium: Right atrial size was normal in size. Pericardium: There is no evidence of pericardial effusion. Mitral Valve: The mitral valve is normal in structure. No evidence of mitral valve regurgitation. No evidence of mitral valve stenosis. Tricuspid Valve: The tricuspid valve is normal in structure. Tricuspid valve regurgitation is not demonstrated. No evidence of tricuspid stenosis. Aortic Valve: The aortic valve is normal in structure. Aortic valve regurgitation is not visualized. No aortic stenosis is present. Pulmonic Valve: The pulmonic valve was normal in structure. Pulmonic valve regurgitation is not visualized. No evidence of pulmonic stenosis. Aorta: The aortic root is normal in size and structure. Venous: The inferior vena cava is normal in size with greater than 50% respiratory variability, suggesting right atrial pressure of 3 mmHg. IAS/Shunts: No atrial level shunt detected by color flow Doppler.  LEFT VENTRICLE PLAX 2D LVIDd:         4.27 cm  Diastology LVIDs:         2.74 cm  LV e' medial:    6.96 cm/s LV PW:         0.99 cm  LV E/e' medial:  8.9 LV IVS:        1.14 cm  LV e' lateral:   8.16 cm/s LVOT diam:     2.00 cm  LV E/e' lateral: 7.6 LV SV:         52 LV SV Index:   23 LVOT Area:     3.14 cm  LEFT ATRIUM             Index LA diam:        2.30 cm 1.01 cm/m LA Vol (A2C):   41.4 ml 18.09 ml/m LA Vol (A4C):   22.8 ml 9.98 ml/m LA Biplane Vol: 29.2 ml 12.78 ml/m  AORTIC VALVE LVOT Vmax:   99.20 cm/s LVOT Vmean:  65.800 cm/s LVOT VTI:    0.164 m  AORTA Ao  Root diam: 3.70 cm MITRAL VALVE MV Area (PHT): 4.68 cm    SHUNTS MV Decel Time: 162 msec    Systemic VTI:  0.16 m MV E velocity: 61.80 cm/s  Systemic Diam: 2.00 cm MV A velocity: 47.70 cm/s MV E/A ratio:  1.30 Mihai Croitoru MD Electronically signed by Thurmon Fair MD Signature Date/Time: 11/23/2019/1:40:14 PM    Final    VAS [place] LOWER EXTREMITY VENOUS (DVT)  Result Date: 11/23/2019  Lower Venous  DVT Study Indications: Edema.  Risk Factors: COVID 19 positive. Limitations: Poor ultrasound/tissue interface. Comparison Study: No prior studies. Performing Technologist: Chanda Busing RVT  Examination Guidelines: A complete evaluation includes B-mode imaging, spectral Doppler, color Doppler, and power Doppler as needed of all accessible portions of each vessel. Bilateral testing is considered an integral part of a complete examination. Limited examinations for reoccurring indications may be performed as noted. The reflux portion of the exam is performed with the patient in reverse Trendelenburg.  +---------+---------------+---------+-----------+----------+--------------+ RIGHT    CompressibilityPhasicitySpontaneityPropertiesThrombus Aging +---------+---------------+---------+-----------+----------+--------------+ CFV      Full           Yes      Yes                                 +---------+---------------+---------+-----------+----------+--------------+ SFJ      Full                                                        +---------+---------------+---------+-----------+----------+--------------+ FV Prox  Full                                                        +---------+---------------+---------+-----------+----------+--------------+ FV Mid   Full                                                        +---------+---------------+---------+-----------+----------+--------------+ FV DistalFull                                                         +---------+---------------+---------+-----------+----------+--------------+ PFV      Full                                                        +---------+---------------+---------+-----------+----------+--------------+ POP      Full           Yes      Yes                                 +---------+---------------+---------+-----------+----------+--------------+ PTV      Full                                                        +---------+---------------+---------+-----------+----------+--------------+ PERO     Full                                                        +---------+---------------+---------+-----------+----------+--------------+   +---------+---------------+---------+-----------+----------+--------------+  LEFT     CompressibilityPhasicitySpontaneityPropertiesThrombus Aging +---------+---------------+---------+-----------+----------+--------------+ CFV      Full           Yes      Yes                                 +---------+---------------+---------+-----------+----------+--------------+ SFJ      Full                                                        +---------+---------------+---------+-----------+----------+--------------+ FV Prox  Full                                                        +---------+---------------+---------+-----------+----------+--------------+ FV Mid   Full                                                        +---------+---------------+---------+-----------+----------+--------------+ FV DistalFull                                                        +---------+---------------+---------+-----------+----------+--------------+ PFV      Full                                                        +---------+---------------+---------+-----------+----------+--------------+ POP      Full           Yes      Yes                                  +---------+---------------+---------+-----------+----------+--------------+ PTV      Full                                                        +---------+---------------+---------+-----------+----------+--------------+ PERO                                                  Not visualized +---------+---------------+---------+-----------+----------+--------------+     Summary: RIGHT: - There is no evidence of deep vein thrombosis in the lower extremity.  - No cystic structure found in the popliteal fossa.  LEFT: - There is no evidence of deep vein thrombosis in the lower extremity. However, portions of this examination were limited- see technologist comments above.  -  No cystic structure found in the popliteal fossa.  *See table(s) above for measurements and observations. Electronically signed by Waverly Ferrari MD on 11/23/2019 at 2:18:43 PM.    Final     Microbiology Recent Results (from the past 240 hour(s))  Culture, respiratory (non-expectorated)     Status: None   Collection Time: 12/06/19  3:43 PM   Specimen: Tracheal Aspirate; Respiratory  Result Value Ref Range Status   Specimen Description   Final    TRACHEAL ASPIRATE Performed at [facility], [address] [place], [place] [address]    Special Requests   Final    NONE Performed at [facility], [address], [place], [place] [address]    Gram Stain   Final    RARE WBC PRESENT,BOTH PMN AND MONONUCLEAR MODERATE GRAM POSITIVE COCCI RARE GRAM POSITIVE RODS    Culture   Final    ABUNDANT STAPHYLOCOCCUS AUREUS No Pseudomonas species isolated Performed at [facility], [address], [place], [place] [address]    Report Status 06-Jan-2020 FINAL  Final   Organism ID, Bacteria STAPHYLOCOCCUS AUREUS  Final      Susceptibility   Staphylococcus aureus - MIC*    CIPROFLOXACIN <=0.5 SENSITIVE Sensitive     ERYTHROMYCIN <=0.25 SENSITIVE Sensitive     GENTAMICIN <=0.5  SENSITIVE Sensitive     OXACILLIN <=0.25 SENSITIVE Sensitive     TETRACYCLINE <=1 SENSITIVE Sensitive     VANCOMYCIN 1 SENSITIVE Sensitive     TRIMETH/SULFA <=10 SENSITIVE Sensitive     CLINDAMYCIN <=0.25 SENSITIVE Sensitive     RIFAMPIN <=0.5 SENSITIVE Sensitive     Inducible Clindamycin NEGATIVE Sensitive     * ABUNDANT STAPHYLOCOCCUS AUREUS  Culture, blood (Routine X 2) w Reflex to ID Panel     Status: None   Collection Time: 12/06/19  9:57 PM   Specimen: BLOOD LEFT ARM  Result Value Ref Range Status   Specimen Description   Final    BLOOD LEFT ARM Performed at [facility], [address], [place], [place] [address]    Special Requests   Final    BOTTLES DRAWN AEROBIC ONLY Blood Culture adequate volume Performed at [facility], [address], [place], [place] [address]    Culture   Final    NO GROWTH 5 DAYS Performed at [facility] [facility], [address] [place], [place] [address]    Report Status 12/12/2019 FINAL  Final  Culture, blood (Routine X 2) w Reflex to ID Panel     Status: None   Collection Time: 12/06/19  9:57 PM   Specimen: BLOOD LEFT HAND  Result Value Ref Range Status   Specimen Description   Final    BLOOD LEFT HAND Performed at [facility], [address] [place], [place] [address]    Special Requests   Final    BOTTLES DRAWN AEROBIC ONLY Blood Culture adequate volume Performed at [facility], [address], [place], [place] [address]    Culture   Final    NO GROWTH 5 DAYS Performed at [facility], [address] [place] [address] [place], [place] [address]    Report Status 12/12/2019 FINAL  Final    Lab Basic Metabolic Panel: Recent Labs  Lab 12/06/19 1757 12/06/19 1757 12/07/19 1053 12/07/19 1800 12/08/19 0502 12/08/19 1830 2020/01/06 1235  NA 140   < > 139 139 138 142 140  K 6.0*   < >  5.3* 5.0 4.3 4.5 4.6  CL 99   < > 96* 97* 94* 92* 88*  CO2 23   < > 26 25 24  32 32  GLUCOSE  152*   < > 219* 225* 207* 181* 172*  BUN 77*   < > 109* 125* 145* 157* 186*  CREATININE 2.36*   < > 4.17* 4.88* 5.98* 7.20* 8.77*  CALCIUM 8.4*   < > 7.4* 7.1* 7.0* 6.5* 6.5*  MG 3.8*  --  3.8* 3.7*  --   --   --   PHOS 10.7*  --  12.1* 13.5*  --   --   --    < > = values in this interval not displayed.   Liver Function Tests: Recent Labs  Lab 12/07/19 1053  AST 101*  ALT 164*  ALKPHOS 65  BILITOT 1.5*  PROT 5.0*  ALBUMIN 2.8*   Recent Labs  Lab 12/07/19 0403  LIPASE 159*   No results for input(s): AMMONIA in the last 168 hours. CBC: Recent Labs  Lab 12/07/19 0403 12/08/19 0502 11/23/2019 0500  WBC 52.9* 36.0* 32.6*  HGB 14.3 10.4* 7.9*  HCT 45.3 31.7* 23.8*  MCV 100.7* 97.2 96.0  PLT 231 137* 100*   Cardiac Enzymes: No results for input(s): CKTOTAL, CKMB, CKMBINDEX, TROPONINI in the last 168 hours. Sepsis Labs: Recent Labs  Lab 12/07/19 0403 12/08/19 0502 12/08/19 0930 12/08/19 1441 12/11/2019 0500  PROCALCITON 2.32 2.65  --   --   --   WBC 52.9* 36.0*  --   --  32.6*  LATICACIDVEN  --   --  1.9 2.7*  --

## 2022-04-10 IMAGING — DX DG CHEST 1V PORT
1 series · 1 of 1 positions shown · non-contrast
Comparison: None.

CLINICAL DATA: 49-year-old male with shortness of breath.

EXAM:
PORTABLE CHEST 1 VIEW

[chest ap]
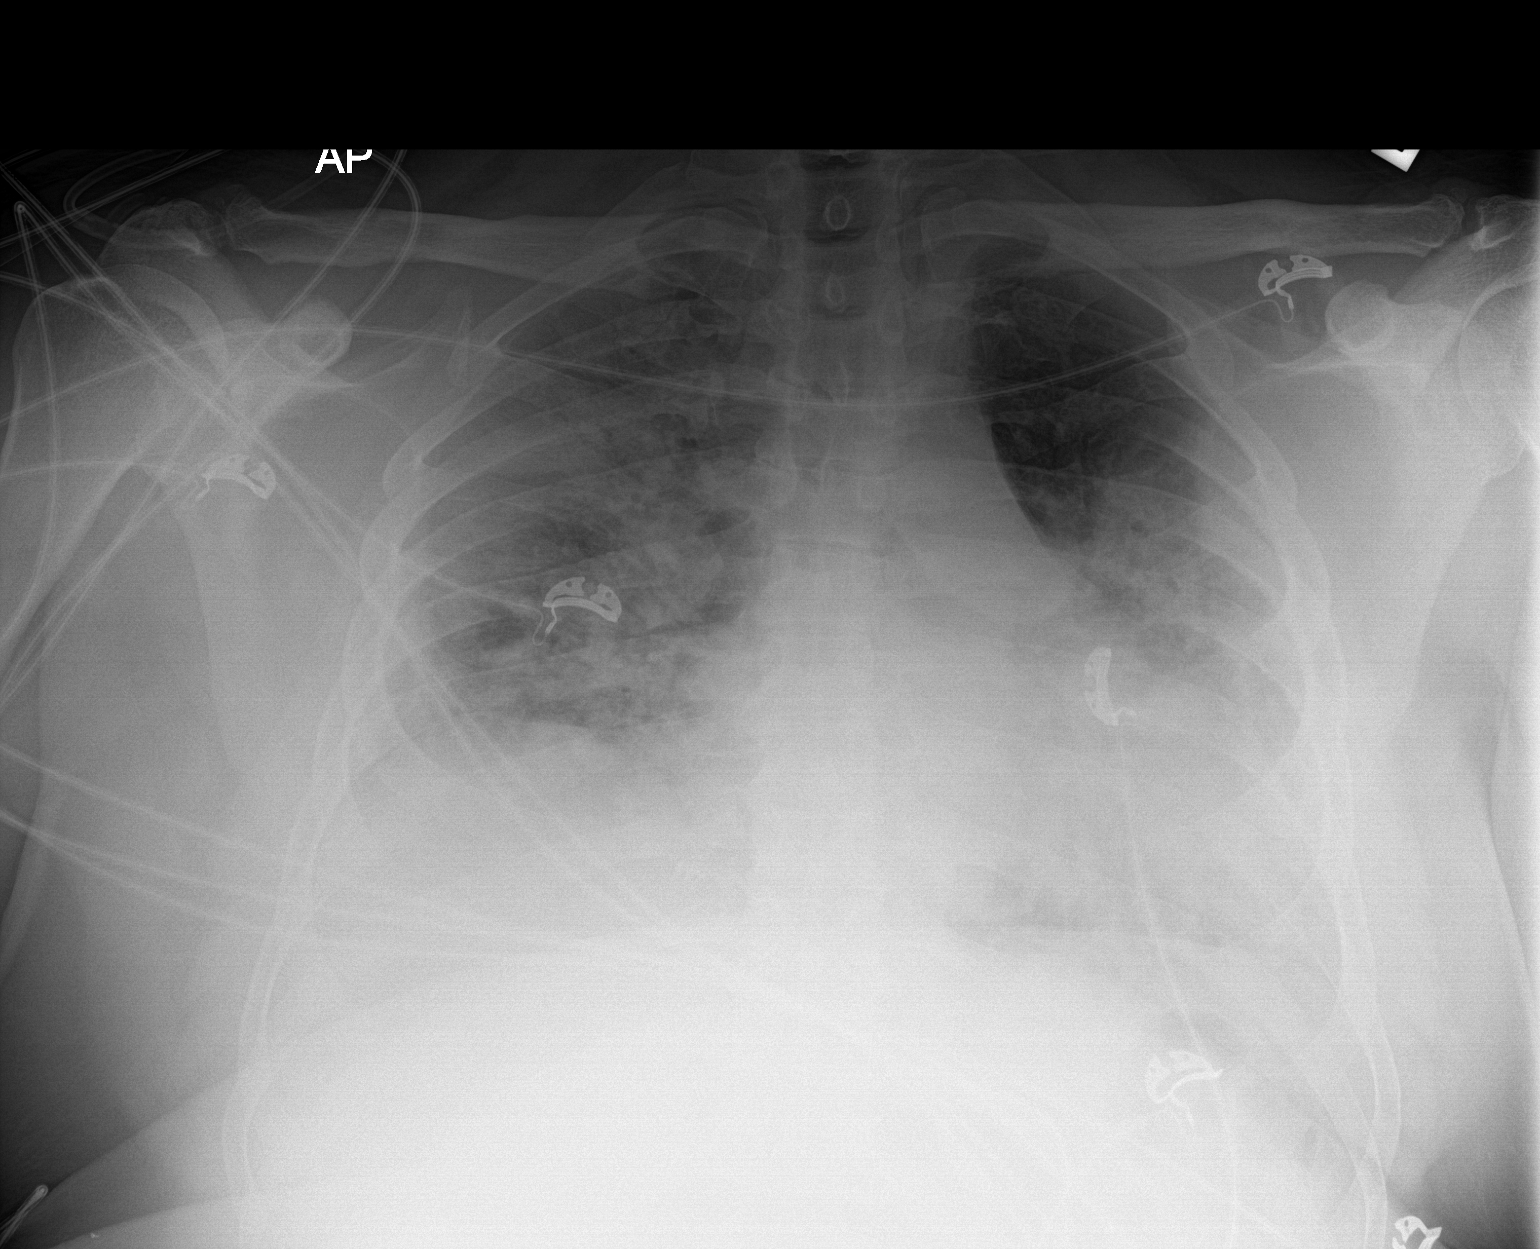

[1 of 1 positions shown; findings below may reference images not displayed]

FINDINGS: Bilateral confluent airspace opacities consistent with multifocal
pneumonia likely viral or atypical in etiology including [id number].
Clinical correlation is recommended. There is no pleural effusion
pneumothorax. Top-normal cardiac silhouette. No acute osseous
pathology.
IMPRESSION: Multifocal pneumonia.
